# Patient Record
Sex: Male | Born: 1965 | Race: White | Hispanic: No | Marital: Married | Smoking: Former smoker
Health system: Southern US, Community
[De-identification: ages and names within clinical notes are randomized; demographics above are authoritative.]

## PROBLEM LIST (undated history)

## (undated) DIAGNOSIS — Z8619 Personal history of other infectious and parasitic diseases: Secondary | ICD-10-CM

## (undated) DIAGNOSIS — I10 Essential (primary) hypertension: Secondary | ICD-10-CM

## (undated) DIAGNOSIS — E785 Hyperlipidemia, unspecified: Secondary | ICD-10-CM

## (undated) HISTORY — PX: HERNIA REPAIR: SHX51

## (undated) HISTORY — DX: Essential (primary) hypertension: I10

## (undated) HISTORY — PX: COLONOSCOPY: SHX174

## (undated) HISTORY — DX: Personal history of other infectious and parasitic diseases: Z86.19

---
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
## 2005-06-23 ENCOUNTER — Emergency Department: Payer: Self-pay | Admitting: Emergency Medicine

## 2006-05-17 ENCOUNTER — Emergency Department: Payer: Self-pay | Admitting: Emergency Medicine

## 2008-01-12 ENCOUNTER — Other Ambulatory Visit: Payer: Self-pay

## 2008-01-12 ENCOUNTER — Emergency Department: Payer: Self-pay | Admitting: Unknown Physician Specialty

## 2008-01-13 HISTORY — PX: OTHER SURGICAL HISTORY: SHX169

## 2009-03-28 IMAGING — CR DG CHEST 2V
1 series · 2 of 2 positions shown · non-contrast
Comparison: none

REASON FOR EXAM: Chest Pain
COMMENTS:

PROCEDURE:     DXR - DXR CHEST PA (OR AP) AND LATERAL  - January 12, 2008  [DATE]
RESULT:      The lung fields are clear. The heart, mediastinal and osseous
structures show no significant abnormalities.

[Series 1: view not recorded · 0.17mm/px · 2 of 2 slices shown]
[im 1/2]
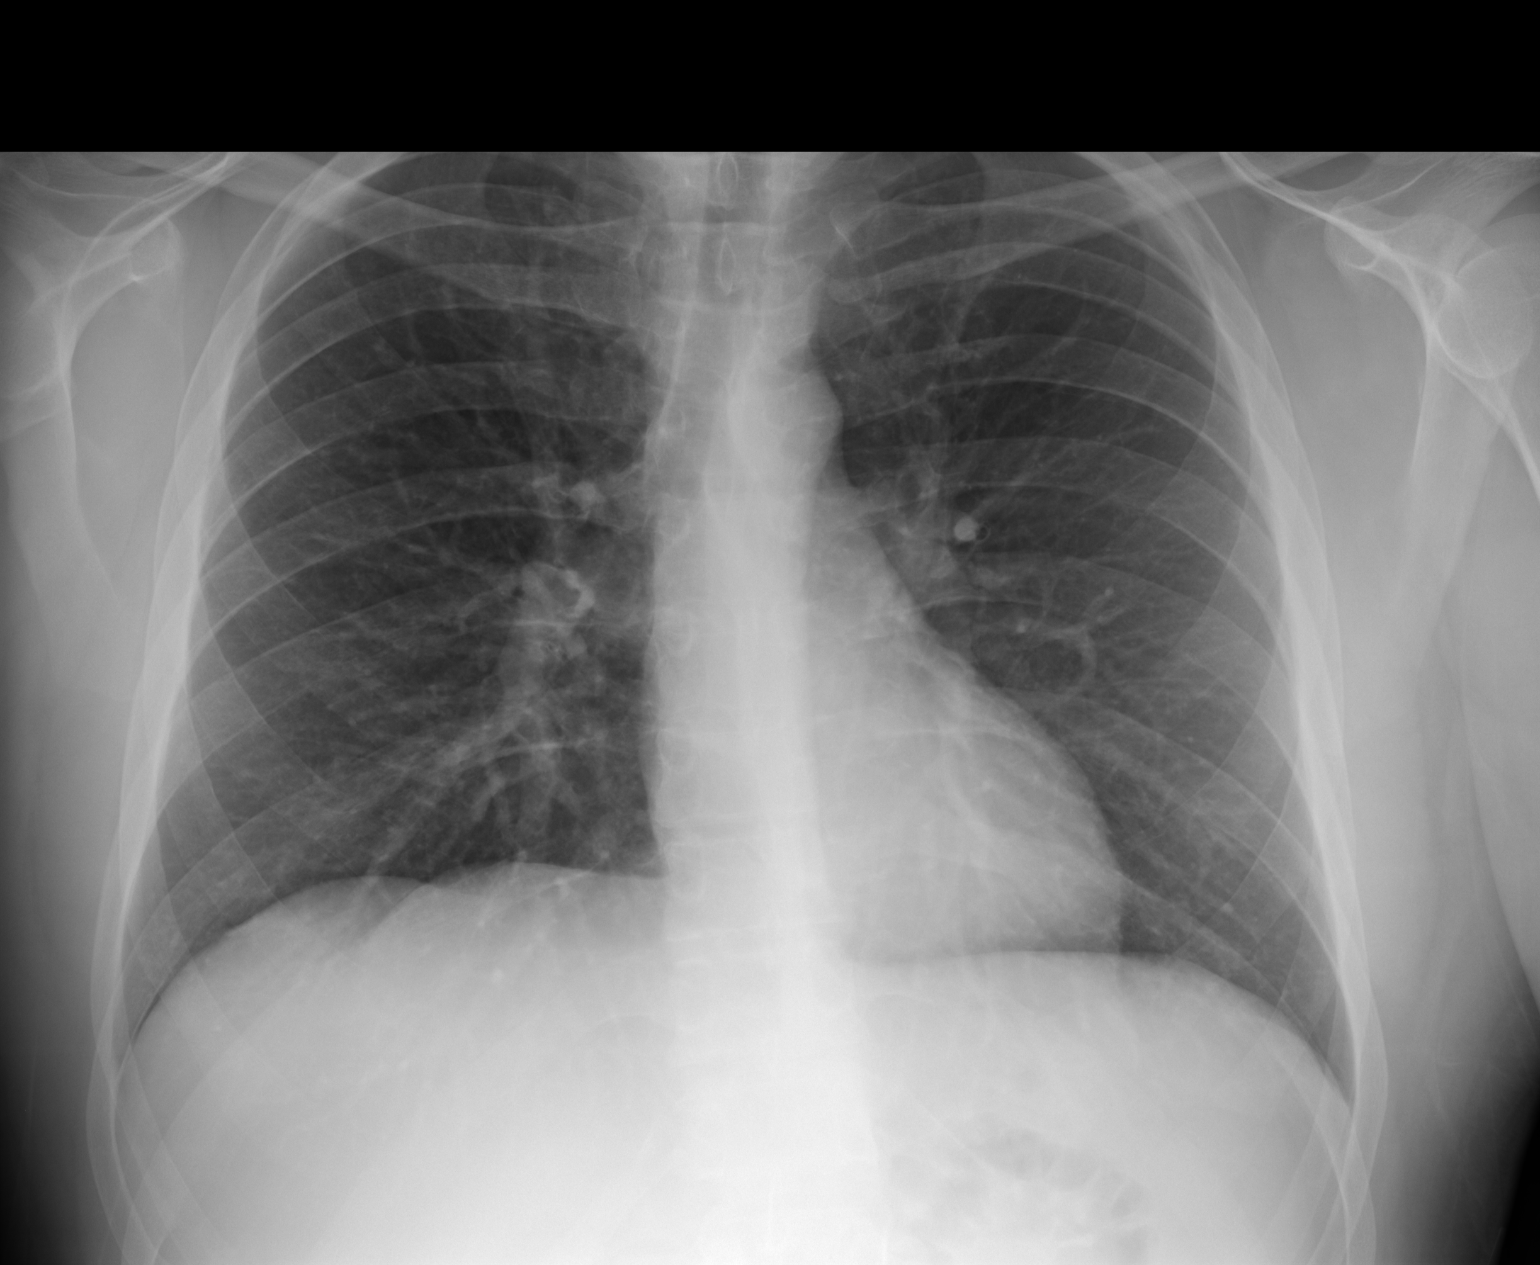
[im 2/2]
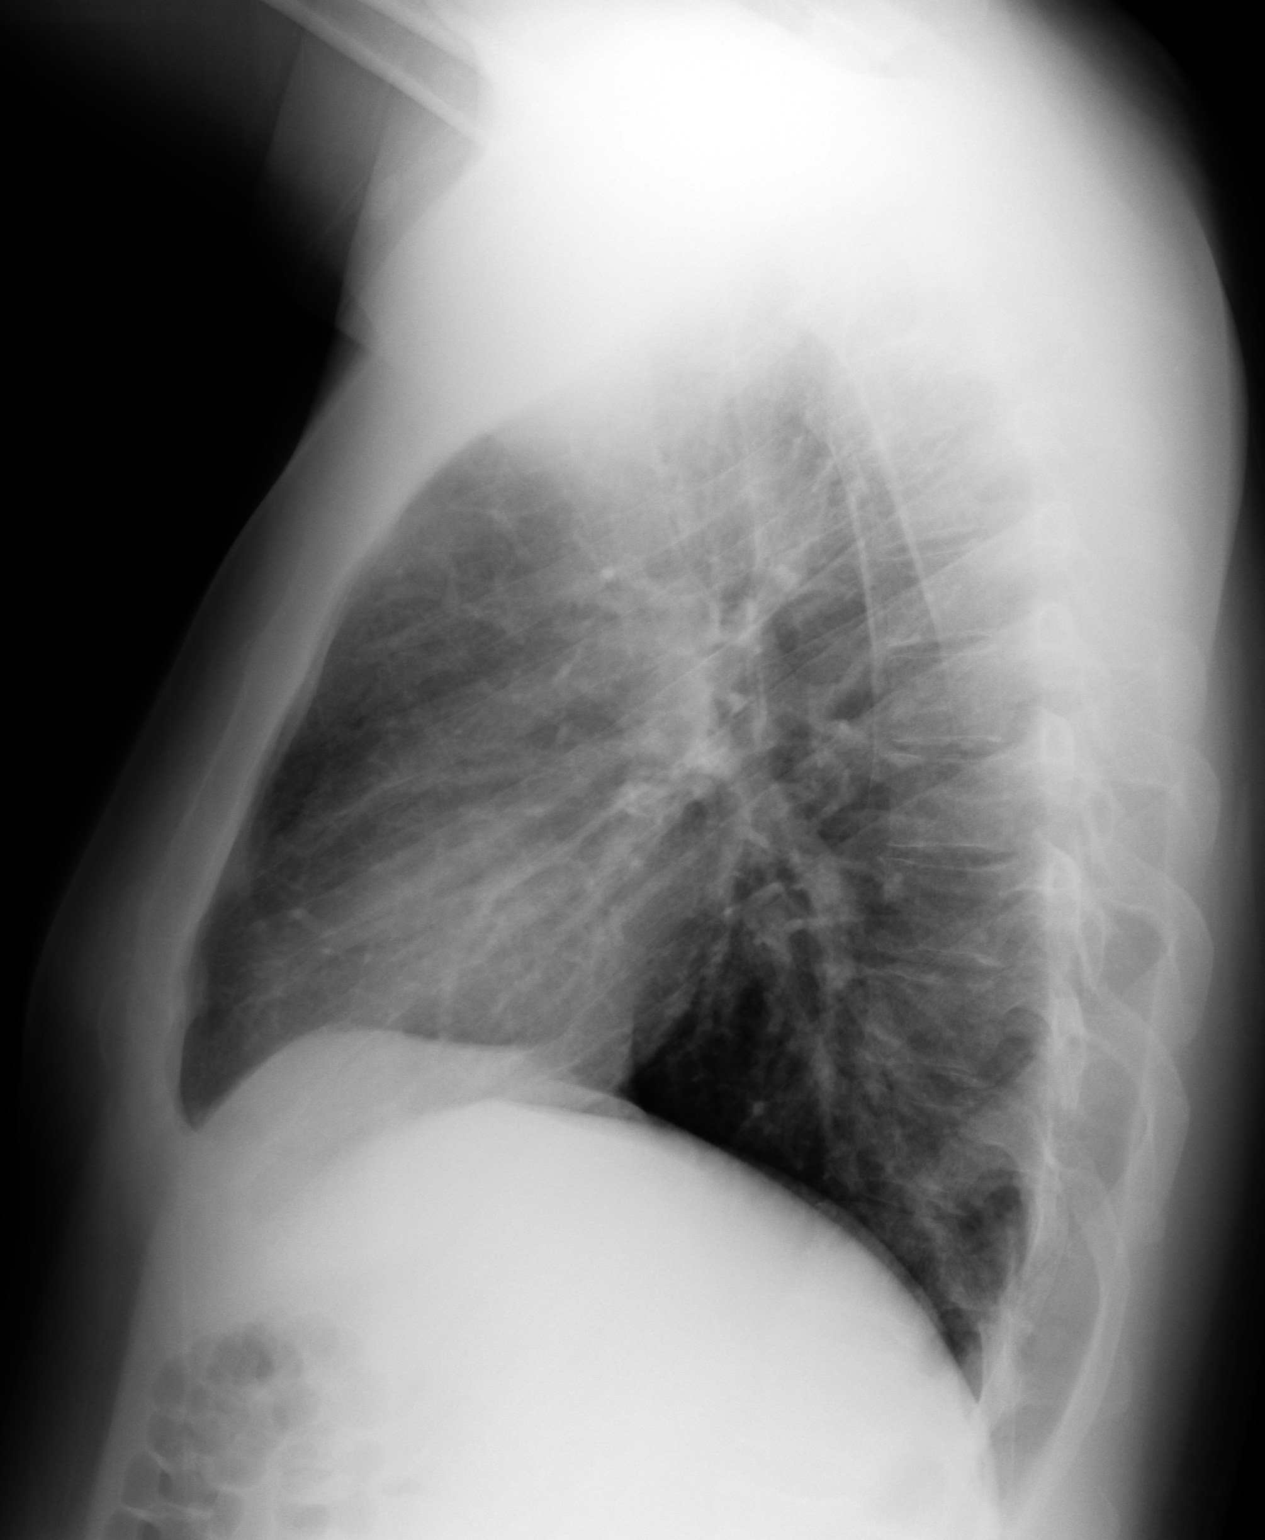

[2 of 2 positions shown; findings below may reference images not displayed]

IMPRESSION: No acute changes are identified.

## 2011-05-22 LAB — LIPID PANEL
CHOLESTEROL: 223 mg/dL — AB (ref 0–200)
HDL: 37 mg/dL (ref 35–70)
LDL Cholesterol: 133 mg/dL
Triglycerides: 267 mg/dL — AB (ref 40–160)

## 2011-05-22 LAB — HEPATIC FUNCTION PANEL: ALT: 25 U/L (ref 10–40)

## 2014-06-08 ENCOUNTER — Ambulatory Visit: Payer: Self-pay | Admitting: Emergency Medicine

## 2014-06-10 ENCOUNTER — Ambulatory Visit: Payer: Self-pay | Admitting: Family Medicine

## 2014-06-18 ENCOUNTER — Ambulatory Visit: Payer: Self-pay

## 2014-12-21 ENCOUNTER — Ambulatory Visit
Admission: EM | Admit: 2014-12-21 | Discharge: 2014-12-21 | Disposition: A | Discharge status: Home / Self Care | Payer: BC Managed Care – PPO | Attending: Family Medicine | Admitting: Family Medicine

## 2014-12-21 ENCOUNTER — Ambulatory Visit: Payer: BC Managed Care – PPO

## 2014-12-21 DIAGNOSIS — M791 Myalgia, unspecified site: Secondary | ICD-10-CM

## 2014-12-21 DIAGNOSIS — M6789 Other specified disorders of synovium and tendon, multiple sites: Secondary | ICD-10-CM | POA: Diagnosis not present

## 2014-12-21 DIAGNOSIS — M79672 Pain in left foot: Secondary | ICD-10-CM | POA: Insufficient documentation

## 2014-12-21 NOTE — ED Provider Notes (Signed)
Patient presents today with symptoms of left lateral foot pain for the last few weeks. Patient denies any trauma or injury to the site. He denies any past history of foot fracture. He does describe the pain as intermittent but daily. He usually wears dress shoes at work and is an Geophysicist/field seismologist principal. He denies any increase in athletic activity recently.  Review of systems negative except mentioned above.  Vitals as per chart.   GENERAL: NAD LEFT FOOT/ANKLE: no deformity appreciated, tenderness along base of the 5th metatarsal extending slightly proximally, FROM, nv intact. There is no pain or swelling along the ankle. Some discomfort in the area with walking on tip toes.  NEURO: CN II-XII groslly intact   A/P: Left lateral foot pain-discussed with patient that there does not appear to be an acute or healing fracture at the base of the fifth metatarsal on x-ray, would treat like peroneal tendinitis at this point. Encourage patient on Cam Walker or other proper shoewear. Patient declined Cam Walker at this time. Encouraged Aleve or Motrin daily for a week. Ice when necessary. If symptoms do persist or worsen I have asked the patient to follow up with podiatry.  Jolene Provost, MD 12/21/14 [id number]

## 2014-12-21 NOTE — ED Notes (Signed)
States left outer foot has been painful for several weeks. Denies injury or trauma

## 2014-12-21 NOTE — Discharge Instructions (Signed)
Ice, proper shoewear, Aleve, f/u with podiatry in 2 weeks if still having symptoms

## 2015-02-17 ENCOUNTER — Emergency Department
Admission: EM | Admit: 2015-02-17 | Discharge: 2015-02-17 | Disposition: A | Discharge status: Home / Self Care | Payer: Worker's Compensation | Attending: Emergency Medicine | Admitting: Emergency Medicine

## 2015-02-17 ENCOUNTER — Encounter: Payer: Self-pay | Admitting: *Deleted

## 2015-02-17 DIAGNOSIS — S61512A Laceration without foreign body of left wrist, initial encounter: Secondary | ICD-10-CM

## 2015-02-17 DIAGNOSIS — Y9289 Other specified places as the place of occurrence of the external cause: Secondary | ICD-10-CM | POA: Diagnosis not present

## 2015-02-17 DIAGNOSIS — Z87891 Personal history of nicotine dependence: Secondary | ICD-10-CM | POA: Insufficient documentation

## 2015-02-17 DIAGNOSIS — S29012A Strain of muscle and tendon of back wall of thorax, initial encounter: Secondary | ICD-10-CM | POA: Insufficient documentation

## 2015-02-17 DIAGNOSIS — Y998 Other external cause status: Secondary | ICD-10-CM | POA: Diagnosis not present

## 2015-02-17 DIAGNOSIS — Y9389 Activity, other specified: Secondary | ICD-10-CM | POA: Insufficient documentation

## 2015-02-17 DIAGNOSIS — Y288XXA Contact with other sharp object, undetermined intent, initial encounter: Secondary | ICD-10-CM | POA: Diagnosis not present

## 2015-02-17 MED ORDER — LIDOCAINE HCL (PF) 1 % IJ SOLN
5.0000 mL | Freq: Once | INTRAMUSCULAR | Status: AC
  Administered 2015-02-17: 5 mL
  Filled 2015-02-17: qty 5

## 2015-02-17 MED ORDER — BACITRACIN ZINC 500 UNIT/GM EX OINT
TOPICAL_OINTMENT | Freq: Two times a day (BID) | CUTANEOUS | Status: DC
Start: 2015-02-17 — End: 2015-02-17
  Administered 2015-02-17: 13:00:00 via TOPICAL
  Filled 2015-02-17: qty 0.9

## 2015-02-17 NOTE — ED Provider Notes (Signed)
[facility] Emergency Department Provider Note ____________________________________________  Time seen: 1120  I have reviewed the triage vital signs and the nursing notes.  HISTORY  Chief Complaint  Laceration  HPI Shaun Holland is a 49 y.o. male reports to the ED for evaluation and treatment of a laceration to the left wrist. He was moving [employer]eps[employer] drink machine and it slipped off the dolly and he tried to keep it upright. He arrives with laceration to left wrist, and also complains of bilateral shoulder pain at this time from the strain of moving machine, bleeding controlled. He is right handed.   Past Medical History  Diagnosis Date  . MI (myocardial infarction)    There are no active problems to display for this patient.  History reviewed. No pertinent past surgical history.  No current outpatient prescriptions on file.  Allergies Review of patient's allergies indicates no known allergies.  Family History  Problem Relation Age of Onset  . Diabetes Father   . Hypertension Father    Social History Social History  Substance Use Topics  . Smoking status: Former Games developer  . Smokeless tobacco: None  . Alcohol Use: Yes     Comment: socially   Review of Systems  Constitutional: Negative for fever. Eyes: Negative for visual changes. ENT: Negative for sore throat. Cardiovascular: Negative for chest pain. Respiratory: Negative for shortness of breath. Gastrointestinal: Negative for abdominal pain, vomiting and diarrhea. Genitourinary: Negative for dysuria. Musculoskeletal: Bilateral upper back strain Skin: Negative for rash. Laceration to the left wrist Neurological: Negative for headaches, focal weakness or numbness. ____________________________________________  PHYSICAL EXAM:  VITAL SIGNS: ED Triage Vitals  Enc Vitals Group     BP 02/17/15 1111 137/90 mmHg     Pulse Rate 02/17/15 1111 88     Resp --      Temp 02/17/15 1111 98.2 F  (36.8 C)     Temp Source 02/17/15 1111 Oral     SpO2 02/17/15 1111 96 %     Weight 02/17/15 1111 250 lb (113.399 kg)     Height 02/17/15 1111  (1.905 m)     Head Cir --      Peak Flow --      Pain Score 02/17/15 1111 6     Pain Loc --      Pain Edu? --      Excl. in GC? --    Constitutional: Alert and oriented. Well appearing and in no distress. Eyes: Conjunctivae are normal. PERRL. Normal extraocular movements. ENT   Head: Normocephalic and atraumatic.   Nose: No congestion/rhinnorhea.   Mouth/Throat: Mucous membranes are moist.   Neck: Supple. No thyromegaly. Hematological/Lymphatic/Immunilogical: No cervical lymphadenopathy. Cardiovascular: Normal rate, regular rhythm. Normal distal pulses Respiratory: Normal respiratory effort. No wheezes/rales/rhonchi. Gastrointestinal: Soft and nontender. No distention. Musculoskeletal: Nontender with normal range of motion in all extremities. Normal grips bilaterally.  Neurologic:  CN II-XII grossly intact. Normal gait without ataxia. Normal speech and language. No gross focal neurologic deficits are appreciated.  Skin:  Skin is warm, dry and intact. No rash noted. Left palm with a semilunar flap laceration to the palm at the base of the thenar prominence. Bleeding is controlled.  Psychiatric: Mood and affect are normal. Patient exhibits appropriate insight and judgment. ____________________________________________  LACERATION REPAIR Performed by: Lissa Hoard Authorized by: Lissa Hoard Consent: Verbal consent obtained. Risks and benefits: risks, benefits and alternatives were discussed Consent given by: patient Patient identity confirmed: provided demographic  data Prepped and Draped in normal sterile fashion Wound explored  Laceration Location: Left Wrist  Laceration Length: 4cm  No Foreign Bodies seen or palpated  Anesthesia: local infiltration  Local anesthetic: lidocaine 1% w/  epinephrine  Anesthetic total: 5 ml  Irrigation method: syringe Amount of cleaning: standard  Skin closure: 4-0 nylon   Number of sutures: 5 + 2  Technique: Horizontal mattress + interrupted   Patient tolerance: Patient tolerated the procedure well with no immediate complications. ____________________________________________  INITIAL IMPRESSION / ASSESSMENT AND PLAN / ED COURSE  Left hand laceration repair. Strain of the trapezius muscles. Wound care instructions provided. Take OTC Tylenol and Motrin. Return in 2 weeks for suture removal. Return in the interim for wound check as needed.  ____________________________________________  FINAL CLINICAL IMPRESSION(S) / ED DIAGNOSES  Final diagnoses:  Laceration of wrist without complication, left, initial encounter     Lissa Hoard, PA-C 02/18/15 1912  Loleta Rose, MD 02/19/15 0002

## 2015-02-17 NOTE — ED Notes (Signed)
Dressing applied to left wrist

## 2015-02-17 NOTE — Discharge Instructions (Signed)
Laceration Care, Adult °A laceration is a cut or lesion that goes through all layers of the skin and into the tissue just beneath the skin. °TREATMENT  °Some lacerations may not require closure. Some lacerations may not be able to be closed due to an increased risk of infection. It is important to see your caregiver as soon as possible after an injury to minimize the risk of infection and maximize the opportunity for successful closure. °If closure is appropriate, pain medicines may be given, if needed. The wound will be cleaned to help prevent infection. Your caregiver will use stitches (sutures), staples, wound glue (adhesive), or skin adhesive strips to repair the laceration. These tools bring the skin edges together to allow for faster healing and a better cosmetic outcome. However, all wounds will heal with a scar. Once the wound has healed, scarring can be minimized by covering the wound with sunscreen during the day for 1 full year. °HOME CARE INSTRUCTIONS  °For sutures or staples: °· Keep the wound clean and dry. °· If you were given a bandage (dressing), you should change it at least once a day. Also, change the dressing if it becomes wet or dirty, or as directed by your caregiver. °· Wash the wound with soap and water 2 times a day. Rinse the wound off with water to remove all soap. Pat the wound dry with a clean towel. °· After cleaning, apply a thin layer of the antibiotic ointment as recommended by your caregiver. This will help prevent infection and keep the dressing from sticking. °· You may shower as usual after the first 24 hours. Do not soak the wound in water until the sutures are removed. °· Only take over-the-counter or prescription medicines for pain, discomfort, or fever as directed by your caregiver. °· Get your sutures or staples removed as directed by your caregiver. °For skin adhesive strips: °· Keep the wound clean and dry. °· Do not get the skin adhesive strips wet. You may bathe  carefully, using caution to keep the wound dry. °· If the wound gets wet, pat it dry with a clean towel. °· Skin adhesive strips will fall off on their own. You may trim the strips as the wound heals. Do not remove skin adhesive strips that are still stuck to the wound. They will fall off in time. °For wound adhesive: °· You may briefly wet your wound in the shower or bath. Do not soak or scrub the wound. Do not swim. Avoid periods of heavy perspiration until the skin adhesive has fallen off on its own. After showering or bathing, gently pat the wound dry with a clean towel. °· Do not apply liquid medicine, cream medicine, or ointment medicine to your wound while the skin adhesive is in place. This may loosen the film before your wound is healed. °· If a dressing is placed over the wound, be careful not to apply tape directly over the skin adhesive. This may cause the adhesive to be pulled off before the wound is healed. °· Avoid prolonged exposure to sunlight or tanning lamps while the skin adhesive is in place. Exposure to ultraviolet light in the first year will darken the scar. °· The skin adhesive will usually remain in place for 5 to 10 days, then naturally fall off the skin. Do not pick at the adhesive film. °You may need a tetanus shot if: °· You cannot remember when you had your last tetanus shot. °· You have never had a tetanus   shot. If you get a tetanus shot, your arm may swell, get red, and feel warm to the touch. This is common and not a problem. If you need a tetanus shot and you choose not to have one, there is a rare chance of getting tetanus. Sickness from tetanus can be serious. SEEK MEDICAL CARE IF:   You have redness, swelling, or increasing pain in the wound.  You see a red line that goes away from the wound.  You have yellowish-white fluid (pus) coming from the wound.  You have a fever.  You notice a bad smell coming from the wound or dressing.  Your wound breaks open before or  after sutures have been removed.  You notice something coming out of the wound such as wood or glass.  Your wound is on your hand or foot and you cannot move a finger or toe. SEEK IMMEDIATE MEDICAL CARE IF:   Your pain is not controlled with prescribed medicine.  You have severe swelling around the wound causing pain and numbness or a change in color in your arm, hand, leg, or foot.  Your wound splits open and starts bleeding.  You have worsening numbness, weakness, or loss of function of any joint around or beyond the wound.  You develop painful lumps near the wound or on the skin anywhere on your body. MAKE SURE YOU:   Understand these instructions.  Will watch your condition.  Will get help right away if you are not doing well or get worse. Document Released: 06/17/2005 Document Revised: 09/09/2011 Document Reviewed: 12/11/2010 [facility] Patient Information 2015 [place], [place]. This information is not intended to replace advice given to you by your health care provider. Make sure you discuss any questions you have with your health care provider.   Keep the wound clean, dry, and covered. Return in 2 weeks for suture removal.

## 2015-02-17 NOTE — ED Notes (Signed)
Pt was moving pepsi drink machine and it slipped off the dolly and pt arrives with laceration to left wrist, pt also complains of shoulder pain at this time from the strain of moving machine, bleeding controlled, left hand warm, sensation intact, cap refill <3 secs

## 2015-03-03 ENCOUNTER — Emergency Department
Admission: EM | Admit: 2015-03-03 | Discharge: 2015-03-03 | Disposition: A | Discharge status: Home / Self Care | Payer: BC Managed Care – PPO | Attending: Emergency Medicine | Admitting: Emergency Medicine

## 2015-03-03 DIAGNOSIS — Z87891 Personal history of nicotine dependence: Secondary | ICD-10-CM | POA: Insufficient documentation

## 2015-03-03 DIAGNOSIS — Z4802 Encounter for removal of sutures: Secondary | ICD-10-CM | POA: Diagnosis present

## 2015-03-03 NOTE — ED Provider Notes (Signed)
[facility] Emergency Department Provider Note   ____________________________________________  Time seen: 8:54 AM  I have reviewed the triage vital signs and the nursing notes.   HISTORY  Chief Complaint Suture / Staple Removal   HPI Shaun Holland is a 49 y.o. male presents to the emergency department for suture removal.Sutures were inserted here on 02/19/2015. He has no complaints today.     Past Medical History  Diagnosis Date  . MI (myocardial infarction)     There are no active problems to display for this patient.   History reviewed. No pertinent past surgical history.  No current outpatient prescriptions on file.  Allergies Review of patient's allergies indicates no known allergies.  Family History  Problem Relation Age of Onset  . Diabetes Father   . Hypertension Father     Social History Social History  Substance Use Topics  . Smoking status: Former Games developer  . Smokeless tobacco: None  . Alcohol Use: Yes     Comment: socially    Review of Systems  Constitutional: Denies fever.  HEENT: No change from baseline Respiratory: No cough or shortness of breath Musculoskeletal: No pain. Skin: healing wound; pain gradually resolving.  ____________________________________________   PHYSICAL EXAM:  VITAL SIGNS: ED Triage Vitals  Enc Vitals Group     BP 03/03/15 0849 150/108 mmHg     Pulse Rate 03/03/15 0849 79     Resp 03/03/15 0849 17     Temp 03/03/15 0849 97.9 F (36.6 C)     Temp Source 03/03/15 0849 Oral     SpO2 03/03/15 0849 98 %     Weight 03/03/15 0849 250 lb (113.399 kg)     Height 03/03/15 0849  (1.88 m)     Head Cir --      Peak Flow --      Pain Score 03/03/15 0846 2     Pain Loc --      Pain Edu? --      Excl. in GC? --       Constitutional: Appears well. No distress HEENT: Atraumtaic, normal appearance, EOMI, sclera normal, voice normal. Respiratory: Respirations even and unlabored.   Cardiovascular: Capillary refill normal. Peripheral pulses 2+ Musculoskeletal: Full ROM x 4. Skin: Laceration to the left hand. No evidence of infection or cellulitis. Neurovascular: Gait steady; Alert and oriented x 4.   PROCEDURES  Procedure(s) performed: SUTURE REMOVAL Performed by:   Consent: Verbal consent obtained. Patient identity confirmed: provided demographic data Time out: Immediately prior to procedure a "time out" was called to verify the correct patient, procedure, equipment, support staff and site/side marked as required.  Location details: Left hand palmar surface near the wrist  Wound Appearance: clean  Sutures/Staples Removed: 5   Facility: sutures placed in this facility Patient tolerance: Patient tolerated the procedure well with no immediate complications.    ____________________________________________   INITIAL IMPRESSION / ASSESSMENT AND PLAN / ED COURSE  Pertinent labs & imaging results that were available during my care of the patient were reviewed by me and considered in my medical decision making (see chart for details).  Wound care discussed. Patient was advised to return to the ER for symptoms that change or worsen if unable to schedule an appointment with primary care.  ____________________________________________   FINAL CLINICAL IMPRESSION(S) / ED DIAGNOSES  Final diagnoses:  Visit for suture removal      Chinita Pester, FNP 03/03/15 1054  Sharyn Creamer, MD 03/03/15 1526

## 2015-03-03 NOTE — ED Notes (Signed)
Pt here for suture removal of left hand

## 2015-03-03 NOTE — Discharge Instructions (Signed)

## 2015-12-13 ENCOUNTER — Encounter: Payer: Self-pay | Admitting: Family Medicine

## 2015-12-13 ENCOUNTER — Ambulatory Visit (INDEPENDENT_AMBULATORY_CARE_PROVIDER_SITE_OTHER): Payer: BC Managed Care – PPO | Admitting: Family Medicine

## 2015-12-13 VITALS — BP 118/82 | HR 77 | Temp 98.0°F | Resp 16 | Wt 254.0 lb

## 2015-12-13 DIAGNOSIS — N539 Unspecified male sexual dysfunction: Secondary | ICD-10-CM | POA: Insufficient documentation

## 2015-12-13 DIAGNOSIS — J029 Acute pharyngitis, unspecified: Secondary | ICD-10-CM

## 2015-12-13 DIAGNOSIS — K649 Unspecified hemorrhoids: Secondary | ICD-10-CM | POA: Insufficient documentation

## 2015-12-13 DIAGNOSIS — K429 Umbilical hernia without obstruction or gangrene: Secondary | ICD-10-CM | POA: Insufficient documentation

## 2015-12-13 LAB — POCT RAPID STREP A (OFFICE): Rapid Strep A Screen: NEGATIVE

## 2015-12-13 NOTE — Progress Notes (Signed)
       Patient: Shaun Holland Male    DOB: [birth year]   49 y.o.   MRN: [id number] Visit Date: 12/13/2015  Today's Provider: Mila Merryonald Sherrilynn Gudgel, MD   Chief Complaint  Patient presents with  . Sore Throat    x 3 days   Subjective:    Sore Throat  This is a new problem. Episode onset: 3 days ago. The problem has been unchanged. The pain is worse on the right side. There has been no fever. Associated symptoms include coughing (dry), headaches, neck pain, shortness of breath and swollen glands. Pertinent negatives include no abdominal pain, congestion, diarrhea, drooling, ear discharge, ear pain, hoarse voice, plugged ear sensation or vomiting. He has had exposure to strep (daughter had strep 2 weeks ago). He has tried nothing for the symptoms.       No Known Allergies Current Meds  Medication Sig  . aspirin 81 MG tablet Take 1 tablet by mouth daily.    Review of Systems  Constitutional: Positive for fatigue. Negative for fever, chills and appetite change.  HENT: Positive for sore throat. Negative for congestion, drooling, ear discharge, ear pain and hoarse voice.   Respiratory: Positive for cough (dry) and shortness of breath. Negative for chest tightness and wheezing.   Cardiovascular: Negative for chest pain and palpitations.  Gastrointestinal: Negative for nausea, vomiting, abdominal pain and diarrhea.  Musculoskeletal: Positive for myalgias and neck pain.  Neurological: Positive for headaches.    Social History  Substance Use Topics  . Smoking status: Former Smoker -- 0.75 packs/day for 22 years    Types: Cigarettes    Quit date: 01/29/2014  . Smokeless tobacco: Not on file  . Alcohol Use: 0.0 oz/week    0 Standard drinks or equivalent per week     Comment: socially   Objective:   BP 118/82 mmHg  Pulse 77  Temp(Src) 98 F (36.7 C) (Oral)  Resp 16  Wt 254 lb (115.214 kg)  SpO2 96%  Physical Exam  General Appearance:    Alert, cooperative, no distress    HENT:   bilateral TM normal without fluid or infection, pharynx erythematous without exudate, sinuses nontender and post nasal drip noted  Eyes:    PERRL, conjunctiva/corneas clear, EOM's intact       Lungs:     Clear to auscultation bilaterally, respirations unlabored  Heart:    Regular rate and rhythm  Neurologic:   Awake, alert, oriented x 3. No apparent focal neurological           defect.       Results for orders placed or performed in visit on 12/13/15  POCT rapid strep A  Result Value Ref Range   Rapid Strep A Screen Negative Negative        Assessment & Plan:     1. Sore throat Counseled regarding signs and symptoms of viral and bacterial respiratory infections. Advised to call or return for additional evaluation if he develops any sign of bacterial infection, or if current symptoms last longer than 10 days.   - POCT rapid strep A     The entirety of the information documented in the History of Present Illness, Review of Systems and Physical Exam were personally obtained by me. Portions of this information were initially documented by Awilda Billoshena Chambers, CMA and reviewed by me for thoroughness and accuracy.    Mila Merryonald Rosemaria Inabinet, MD  [facility]

## 2015-12-13 NOTE — Patient Instructions (Signed)

## 2016-03-06 IMAGING — CR DG FOOT COMPLETE 3+V*L*
3 series · 3 of 3 positions shown · non-contrast
Comparison: None.

CLINICAL DATA: Left foot pain for 2 weeks, no known injury, initial
encounter

EXAM:
LEFT FOOT - COMPLETE 3+ VIEW

[foot ap]
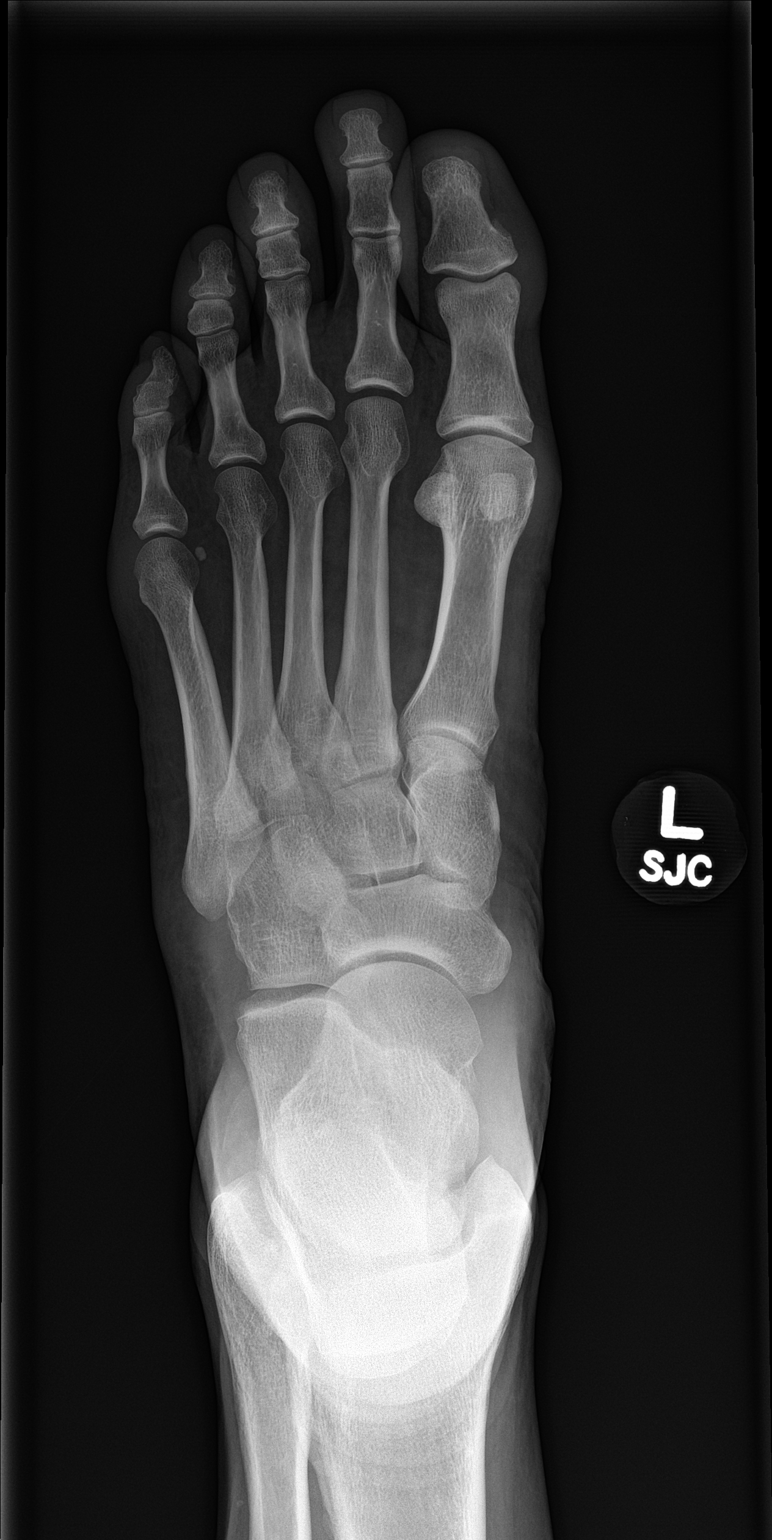

[foot obl]
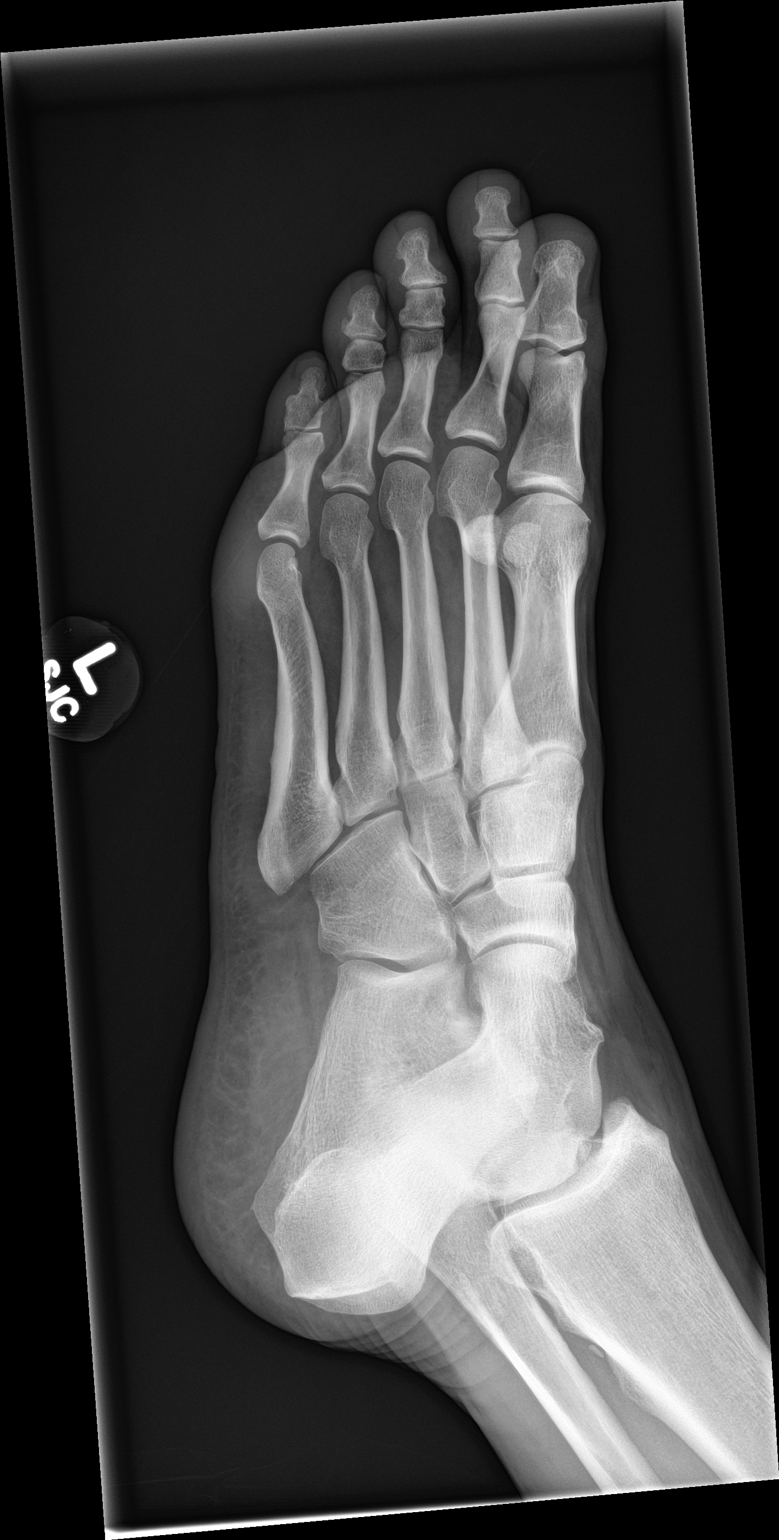

[foot lat]
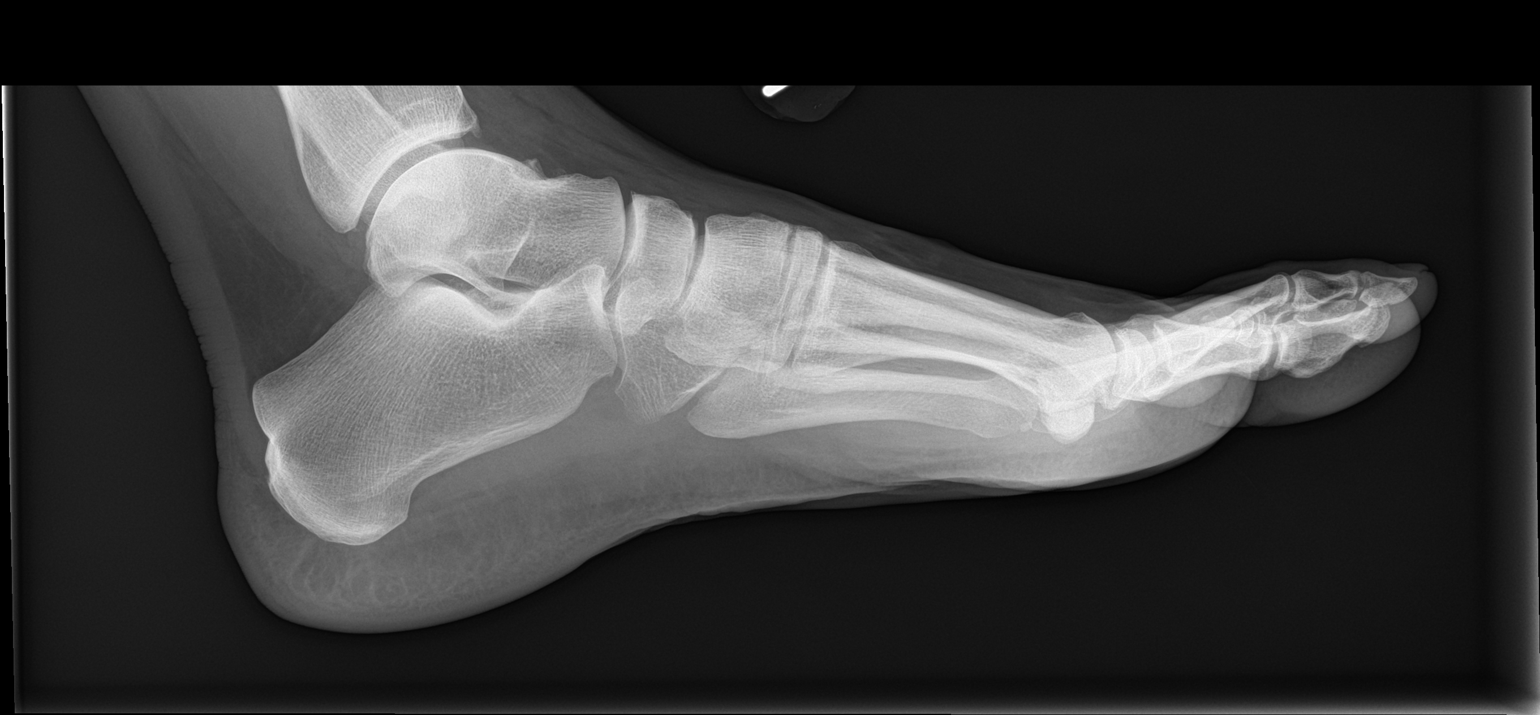

[3 of 3 positions shown; findings below may reference images not displayed]

FINDINGS: There is no evidence of fracture or dislocation. There is no
evidence of arthropathy or other focal bone abnormality. Soft
tissues are unremarkable.
IMPRESSION: No acute abnormality noted.

## 2017-06-10 ENCOUNTER — Encounter: Payer: Self-pay | Admitting: Gynecology

## 2017-06-10 ENCOUNTER — Other Ambulatory Visit: Payer: Self-pay

## 2017-06-10 ENCOUNTER — Ambulatory Visit
Admission: EM | Admit: 2017-06-10 | Discharge: 2017-06-10 | Disposition: A | Discharge status: Home / Self Care | Payer: BC Managed Care – PPO | Attending: Family Medicine | Admitting: Family Medicine

## 2017-06-10 DIAGNOSIS — M25532 Pain in left wrist: Secondary | ICD-10-CM

## 2017-06-10 MED ORDER — MELOXICAM 15 MG PO TABS: 15.0000 mg | ORAL_TABLET | Freq: Every day | ORAL | 0 refills | Status: DC

## 2017-06-10 NOTE — Discharge Instructions (Signed)
Wear the brace for the next 2 weeks.  Mobic as prescribed.  Take care  Dr. Adriana Simasook

## 2017-06-10 NOTE — ED Triage Notes (Signed)
Patient c/o left wrist pain x 1 month ago. Per patient not sure of the reason for the pain.

## 2017-06-10 NOTE — ED Provider Notes (Signed)
[facility]    CSN: [id number] Arrival date & time: 06/10/17  1014  History   Chief Complaint Chief Complaint  Patient presents with  . Wrist Pain   HPI  51 year old male presents with left wrist pain.  Left wrist pain  Times 1 month.  Located on the ulnar side of the wrist.  Described as achy.  Sharp pain at times. Mild-moderate in severity. Currently 4/10.  He is taking ibuprofen briefly without improvement.  Worse with certain ranges of motion.  No known relieving factors.  He does not recall an inciting fall, trauma, injury.  No reports of swelling.  No other associated symptoms.  No other complaints at this time.  Past Medical History:  Diagnosis Date  . History of chicken pox   . History of mumps   . Hyperlipidemia   . MI (myocardial infarction) [facility](HCC)    Patient Active Problem List   Diagnosis Date Noted  . Hemorrhoid 12/13/2015  . Umbilical hernia 12/13/2015  . 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 Hyperlipidemia, mixed 02/01/2009  . Fam hx-ischem heart disease 01/31/2009  . Tobacco use disorder 07/01/1978   Past Surgical History:  Procedure Laterality Date  . Echocardiogram  01/13/2008   [facility]; Normal LVF EF= 66%  . Myocardial Perfusion Scan  01/13/2008   Done by Dr. [place]n [facility]. Normal perfusion on function    Home Medications    Prior to Admission medications   Medication Sig Start Date End Date Taking? Authorizing Provider  aspirin 81 MG tablet Take 1 tablet by mouth daily. 03/07/09   [provider]  meloxicam (MOBIC) 15 MG tablet Take 1 tablet (15 mg total) by mouth daily. 06/10/17   Tommie Samsook, Alera Quevedo G, DO    Family History Family History  Problem Relation Age of Onset  . Diabetes Father        type 2  . Hypertension Father   . Hyperlipidemia Father   . Stroke Father   . Heart attack Other   . Heart attack Other        in his 3550's    Social History Social History   Tobacco Use  . Smoking status: Former  Smoker    Packs/day: 0.75    Years: 22.00    Pack years: 16.50    Types: Cigarettes    Last attempt to quit: 01/29/2014    Years since quitting: 3.3  . Smokeless tobacco: Never Used  Substance Use Topics  . Alcohol use: Yes    Alcohol/week: 0.0 oz    Comment: socially  . Drug use: No     Allergies   Patient has no known allergies.   Review of Systems Review of Systems  Constitutional: Negative.   Musculoskeletal:       Wrist pain, left. No swelling.    Physical Exam Triage Vital Signs ED Triage Vitals  Enc Vitals Group     BP 06/10/17 1042 121/84     Pulse Rate 06/10/17 1042 97     Resp 06/10/17 1042 18     Temp 06/10/17 1042 97.9 F (36.6 C)     Temp Source 06/10/17 1042 Oral     SpO2 06/10/17 1042 97 %     Weight 06/10/17 1041 250 lb (113.4 kg)     Height 06/10/17 1041 6\' 3"  (1.905 m)     Head Circumference --      Peak Flow --      Pain Score 06/10/17 1042 4     Pain  Loc --      Pain Edu? --      Excl. in GC? --    No data found.  Updated Vital Signs BP 121/84 (BP Location: Left Arm)   Pulse 97   Temp 97.9 F (36.6 C) (Oral)   Resp 18   Ht 6\' 3"  (1.905 m)   Wt 250 lb (113.4 kg)   SpO2 97%   BMI 31.25 kg/m  Physical Exam  Constitutional: He is oriented to person, place, and time. He appears well-developed and well-nourished. No distress.  HENT:  Head: Normocephalic and atraumatic.  Eyes: Conjunctivae are normal. No scleral icterus.  Cardiovascular: Normal rate and regular rhythm.  No murmur heard. Pulmonary/Chest: Effort normal and breath sounds normal. No respiratory distress. He has no wheezes.  Musculoskeletal:  Left wrist -patient with tenderness over the ulna distally.  No swelling or erythema.  Normal range of motion of the wrist.  Neurological: He is alert and oriented to person, place, and time.  Skin: Skin is warm. No rash noted.  Psychiatric: He has a normal mood and affect. His behavior is normal.  Nursing note and vitals  reviewed.  UC Treatments / Results  Labs (all labs ordered are listed, but only abnormal results are displayed) Labs Reviewed - No data to display  EKG  EKG Interpretation None       Radiology No results found.  Procedures Procedures (including critical care time)  Medications Ordered in UC Medications - No data to display   Initial Impression / Assessment and Plan / UC Course  I have reviewed the triage vital signs and the nursing notes.  Pertinent labs & imaging results that were available during my care of the patient were reviewed by me and considered in my medical decision making (see chart for details).     51 year old male presents with left wrist pain.  Pain is located on the ulnar aspect.  Possible TFCC issue/injury. Placing in brace. Mobic daily x 2 weeks.  If persists, advised to see orthopedics.  Final Clinical Impressions(s) / UC Diagnoses   Final diagnoses:  Left wrist pain    ED Discharge Orders        Ordered    meloxicam (MOBIC) 15 MG tablet  Daily     06/10/17 1148     Controlled Substance Prescriptions [place] Controlled Substance Registry consulted? Not Applicable   Tommie SamsCook, Aidynn Polendo G, DO 06/10/17 1155

## 2018-02-02 NOTE — Progress Notes (Signed)
Patient: Shaun Holland, Male    DOB: July 03, 1965, 52 y.o.   MRN: [id number] Visit Date: 02/03/2018  Today's Provider: Mila Merry, MD   Chief Complaint  Patient presents with  . Annual Exam  . Hyperlipidemia   Subjective:    Annual physical exam Inman Fettig is a 52 y.o. male who presents today for health maintenance and complete physical. He feels well. He reports exercising yes/walking. He reports he is sleeping well.  ---------------------------------------------------------------   Lipid/Cholesterol, Follow-up:   Last seen for this 05/22/2011.  Management since that visit includes; labs checked. Advised to continue current medication. And cut back on saturated fats.  Last Lipid Panel:    Component Value Date/Time   CHOL 223 (A) 05/22/2011   TRIG 267 (A) 05/22/2011   HDL 37 05/22/2011   LDLCALC 133 05/22/2011    He reports good compliance with treatment. He is not having side effects. none  Wt Readings from Last 3 Encounters:  02/03/18 260 lb (117.9 kg)  06/10/17 250 lb (113.4 kg)  12/13/15 254 lb (115.2 kg)    ---------------------------------------------------------------  Tobacco use From 03/09/2014-recommended smoking cessation.   Review of Systems  Constitutional: Negative for chills, diaphoresis and fever.  HENT: Negative for congestion, ear discharge, ear pain, hearing loss, nosebleeds, sore throat and tinnitus.   Eyes: Negative for photophobia, pain, discharge and redness.  Respiratory: Negative for cough, shortness of breath, wheezing and stridor.   Cardiovascular: Negative for chest pain, palpitations and leg swelling.  Gastrointestinal: Negative for abdominal pain, blood in stool, constipation, diarrhea, nausea and vomiting.  Endocrine: Negative for polydipsia.  Genitourinary: Negative for dysuria, flank pain, frequency, hematuria and urgency.  Musculoskeletal: Positive for back pain. Negative for myalgias and neck pain.    Skin: Negative for rash.  Allergic/Immunologic: Negative for environmental allergies.  Neurological: Negative for dizziness, tremors, seizures, weakness and headaches.  Hematological: Does not bruise/bleed easily.  Psychiatric/Behavioral: Negative for hallucinations and suicidal ideas. The patient is not nervous/anxious.     Social History      He  reports that he quit smoking about 4 years ago. His smoking use included cigarettes. He has a 16.50 pack-year smoking history. He has never used smokeless tobacco. He reports that he drinks alcohol. He reports that he does not use drugs.       Social History   Socioeconomic History  . Marital status: Married    Spouse name: Not on file  . Number of children: Not on file  . Years of education: Not on file  . Highest education level: Not on file  Occupational History  . Occupation: Tax inspector  Social Needs  . Financial resource strain: Not on file  . Food insecurity:    Worry: Not on file    Inability: Not on file  . Transportation needs:    Medical: Not on file    Non-medical: Not on file  Tobacco Use  . Smoking status: Former Smoker    Packs/day: 0.75    Years: 22.00    Pack years: 16.50    Types: Cigarettes    Last attempt to quit: 01/29/2014    Years since quitting: 4.0  . Smokeless tobacco: Never Used  Substance and Sexual Activity  . Alcohol use: Yes    Alcohol/week: 0.0 oz    Comment: socially  . Drug use: No  . Sexual activity: Not on file  Lifestyle  . Physical activity:    Days per week: Not  on file    Minutes per session: Not on file  . Stress: Not on file  Relationships  . Social connections:    Talks on phone: Not on file    Gets together: Not on file    Attends religious service: Not on file    Active member of club or organization: Not on file    Attends meetings of clubs or organizations: Not on file    Relationship status: Not on file  Other Topics Concern  . Not on file  Social History  Narrative  . Not on file    Past Medical History:  Diagnosis Date  . History of chicken pox   . History of mumps   . Hyperlipidemia   . MI (myocardial infarction) [facility])      Patient Active Problem List   Diagnosis Date Noted  . Hemorrhoid 12/13/2015  . Umbilical hernia 12/13/2015  . 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 Hyperlipidemia, mixed 02/01/2009  . Fam hx-ischem heart disease 01/31/2009  . Tobacco use disorder 07/01/1978    Past Surgical History:  Procedure Laterality Date  . Echocardiogram  01/13/2008   [facility]; Normal LVF EF= 66%  . Myocardial Perfusion Scan  01/13/2008   Done by Dr. Juliann Pares. Normal perfusion on function    Family History        Family Status  Relation Name Status  . Father  Alive  . Mother  Alive  . Sister  Alive  . Brother  Alive  . Brother  Alive  . Other Uncle Deceased  . Other Grandparent Deceased        His family history includes Diabetes in his father; Heart attack in his other and other; Hyperlipidemia in his father; Hypertension in his father; Stroke in his father.      No Known Allergies   Current Outpatient Medications:  .  aspirin 81 MG tablet, Take 1 tablet by mouth daily., Disp: , Rfl:  .  meloxicam (MOBIC) 15 MG tablet, Take 1 tablet (15 mg total) by mouth daily. (Patient not taking: Reported on 02/03/2018), Disp: 15 tablet, Rfl: 0   Patient Care Team: Malva Limes, MD as PCP - General (Family Medicine) Alwyn Pea, MD as Consulting Physician (Cardiology)      Objective:   Vitals: BP 112/77 (BP Location: Right Arm, Patient Position: Sitting, Cuff Size: Large)   Pulse 73   Temp 98 F (36.7 C) (Oral)   Resp 16   Ht 6\' 2"  (1.88 m)   Wt 260 lb (117.9 kg)   SpO2 99%   BMI 33.38 kg/m       Physical Exam   General Appearance:    Alert, cooperative, no distress, appears stated age  Head:    Normocephalic, without obvious abnormality, atraumatic  Eyes:    PERRL, conjunctiva/corneas clear, EOM's  intact, fundi    benign, both eyes       Ears:    Normal TM's and external ear canals, both ears  Nose:   Nares normal, septum midline, mucosa normal, no drainage   or sinus tenderness  Throat:   Lips, mucosa, and tongue normal; teeth and gums normal  Neck:   Supple, symmetrical, trachea midline, no adenopathy;       thyroid:  No enlargement/tenderness/nodules; no carotid   bruit or JVD  Back:     Symmetric, no curvature, ROM normal, no CVA tenderness  Lungs:     Clear to auscultation bilaterally, respirations unlabored  Chest wall:  No tenderness or deformity  Heart:    Regular rate and rhythm, S1 and S2 normal, no murmur, rub   or gallop  Abdomen:     Soft, non-tender, bowel sounds active all four quadrants,    no masses, no organomegaly  Genitalia:    deferred  Rectal:    deferred  Extremities:   Extremities normal, atraumatic, no cyanosis or edema  Pulses:   2+ and symmetric all extremities  Skin:   Skin color, texture, turgor normal, no rashes or lesions  Lymph nodes:   Cervical, supraclavicular, and axillary nodes normal  Neurologic:   CNII-XII intact. Normal strength, sensation and reflexes      throughout    Depression Screen PHQ 2/9 Scores 02/03/2018  PHQ - 2 Score 0  PHQ- 9 Score 1      Assessment & Plan:     Routine Health Maintenance and Physical Exam  Exercise Activities and Dietary recommendations Goals    None      Immunization History  Administered Date(s) Administered  . Tdap 10/17/2010    Health Maintenance  Topic Date Due  . HIV Screening  03/23/1982  . COLONOSCOPY  03/23/2017  . INFLUENZA VACCINE  01/29/2018  . TETANUS/TDAP  10/16/2020     Discussed health benefits of physical activity, and encouraged him to engage in regular exercise appropriate for his age and condition.    --------------------------------------------------------------------  1. Annual physical exam  - Comprehensive metabolic panel - Lipid panel - PSA  2. BMI  33.0-33.9,adult   3. Hyperlipidemia, mixed  - Comprehensive metabolic panel - Lipid panel  4. Hemorrhoids, unspecified hemorrhoid type He is bothered by these and is interested in having them removed. Will refer GI. Hi is interested in Cologuard, but hemorrhoid may increase risk for false positive. Will have him discuss colonoscopy with GI versus doing Cologuard after hemorrhoids are removed.   5. Need for shingles vaccine  - Varicella-zoster vaccine IM  6. Prostate cancer screening  - PSA    Mila Merryonald Denym Rahimi, MD  [facility]

## 2018-02-03 ENCOUNTER — Encounter: Payer: Self-pay | Admitting: Family Medicine

## 2018-02-03 ENCOUNTER — Ambulatory Visit (INDEPENDENT_AMBULATORY_CARE_PROVIDER_SITE_OTHER): Payer: BC Managed Care – PPO | Admitting: Family Medicine

## 2018-02-03 VITALS — BP 112/77 | HR 73 | Temp 98.0°F | Resp 16 | Ht 74.0 in | Wt 260.0 lb

## 2018-02-03 DIAGNOSIS — E782 Mixed hyperlipidemia: Secondary | ICD-10-CM | POA: Diagnosis not present

## 2018-02-03 DIAGNOSIS — Z23 Encounter for immunization: Secondary | ICD-10-CM | POA: Diagnosis not present

## 2018-02-03 DIAGNOSIS — Z6833 Body mass index (BMI) 33.0-33.9, adult: Secondary | ICD-10-CM

## 2018-02-03 DIAGNOSIS — K649 Unspecified hemorrhoids: Secondary | ICD-10-CM

## 2018-02-03 DIAGNOSIS — Z125 Encounter for screening for malignant neoplasm of prostate: Secondary | ICD-10-CM

## 2018-02-03 DIAGNOSIS — Z0000 Encounter for general adult medical examination without abnormal findings: Secondary | ICD-10-CM | POA: Diagnosis not present

## 2018-02-03 NOTE — Patient Instructions (Signed)
It is recommended to engage in 150 minutes of moderate exercise every week.   

## 2018-02-09 ENCOUNTER — Encounter: Payer: Self-pay | Admitting: Gastroenterology

## 2018-04-06 ENCOUNTER — Ambulatory Visit: Payer: Self-pay | Admitting: Family Medicine

## 2018-06-03 ENCOUNTER — Encounter: Payer: Self-pay | Admitting: Family Medicine

## 2018-06-03 ENCOUNTER — Ambulatory Visit: Payer: BC Managed Care – PPO | Admitting: Family Medicine

## 2018-06-03 VITALS — BP 122/76 | HR 80 | Temp 98.1°F | Resp 16 | Wt 263.0 lb

## 2018-06-03 DIAGNOSIS — J40 Bronchitis, not specified as acute or chronic: Secondary | ICD-10-CM

## 2018-06-03 MED ORDER — DOXYCYCLINE HYCLATE 100 MG PO TABS: 100.0000 mg | ORAL_TABLET | Freq: Two times a day (BID) | ORAL | 0 refills | Status: DC

## 2018-06-03 NOTE — Progress Notes (Signed)
       Patient: Shaun Holland Male    DOB: [birth year]   52 y.o.   MRN: [id number] Visit Date: 06/03/2018  Today's Provider: Mila Merryonald Ricardo Kayes, MD   Chief Complaint  Patient presents with  . Cough    x 8 days   Subjective:    Cough  This is a new problem. The problem has been unchanged. The cough is productive of sputum. Associated symptoms include chills, a fever (resolved), headaches, nasal congestion, postnasal drip, a sore throat, sweats (resolved) and wheezing. Pertinent negatives include no chest pain, hemoptysis, rhinorrhea or shortness of breath. Treatments tried: Halls cough drops, Tylenol and Tea with Honey. The treatment provided no relief.  He states head congestion and sore throat have resolved, but continues to have persistent cough.    No Known Allergies    Current Outpatient Medications:  .  aspirin 81 MG tablet, Take 1 tablet by mouth daily., Disp: , Rfl:   Review of Systems  Constitutional: Positive for chills, diaphoresis, fatigue and fever (resolved). Negative for appetite change.  HENT: Positive for congestion, postnasal drip and sore throat. Negative for rhinorrhea.   Respiratory: Positive for cough and wheezing. Negative for hemoptysis, chest tightness and shortness of breath.   Cardiovascular: Negative for chest pain and palpitations.  Gastrointestinal: Negative for abdominal pain, nausea and vomiting.  Neurological: Positive for headaches.    Social History   Tobacco Use  . Smoking status: Former Smoker    Packs/day: 0.75    Years: 22.00    Pack years: 16.50    Types: Cigarettes    Last attempt to quit: 01/29/2014    Years since quitting: 4.3  . Smokeless tobacco: Never Used  Substance Use Topics  . Alcohol use: Yes    Alcohol/week: 0.0 standard drinks    Comment: socially   Objective:   BP 122/76 (BP Location: Left Arm, Patient Position: Sitting, Cuff Size: Large)   Pulse 80   Temp 98.1 F (36.7 C) (Oral)   Resp 16   Wt 263 lb (119.3  kg)   SpO2 96% Comment: room air  BMI 33.77 kg/m  Vitals:   06/03/18 0808  BP: 122/76  Pulse: 80  Resp: 16  Temp: 98.1 F (36.7 C)  TempSrc: Oral  SpO2: 96%  Weight: 263 lb (119.3 kg)     Physical Exam  General Appearance:    Alert, cooperative, no distress  HENT:   ENT exam normal, no neck nodes or sinus tenderness  Eyes:    PERRL, conjunctiva/corneas clear, EOM's intact       Lungs:     Occasional faint expiratory wheeze, no rales,  Heart:    Regular rate and rhythm  Neurologic:   Awake, alert, oriented x 3. No apparent focal neurological           defect.           Assessment & Plan:     1. Bronchitis  - doxycycline (VIBRA-TABS) 100 MG tablet; Take 1 tablet (100 mg total) by mouth 2 (two) times daily.  Dispense: 20 tablet; Refill: 0  He declined flu vaccine due to acute illness today.       Mila Merryonald Karuna Balducci, MD  [facility]

## 2018-09-06 ENCOUNTER — Ambulatory Visit
Admission: EM | Admit: 2018-09-06 | Discharge: 2018-09-06 | Disposition: A | Discharge status: Home / Self Care | Payer: BC Managed Care – PPO | Attending: Emergency Medicine | Admitting: Emergency Medicine

## 2018-09-06 ENCOUNTER — Other Ambulatory Visit: Payer: Self-pay

## 2018-09-06 DIAGNOSIS — J029 Acute pharyngitis, unspecified: Secondary | ICD-10-CM

## 2018-09-06 DIAGNOSIS — R05 Cough: Secondary | ICD-10-CM | POA: Diagnosis not present

## 2018-09-06 DIAGNOSIS — Z87891 Personal history of nicotine dependence: Secondary | ICD-10-CM | POA: Diagnosis not present

## 2018-09-06 DIAGNOSIS — R059 Cough, unspecified: Secondary | ICD-10-CM

## 2018-09-06 LAB — RAPID STREP SCREEN (MED CTR MEBANE ONLY): Streptococcus, Group A Screen (Direct): NEGATIVE

## 2018-09-06 MED ORDER — AMOXICILLIN 500 MG PO CAPS: 500.0000 mg | ORAL_CAPSULE | Freq: Two times a day (BID) | ORAL | 0 refills | Status: DC

## 2018-09-06 MED ORDER — BENZONATATE 200 MG PO CAPS: ORAL_CAPSULE | ORAL | 0 refills | Status: DC

## 2018-09-06 NOTE — ED Triage Notes (Signed)
Pt states sore throat and cough starting yesterday after outside in the wind. Pain 3/10.

## 2018-09-06 NOTE — ED Provider Notes (Signed)
[facility]    CSN: [id number] Arrival date & time: 09/06/18  0945     History   Chief Complaint Chief Complaint  Patient presents with  . Sore Throat  . Cough    HPI Shaun Holland is a 53 y.o. male.   HPI  -year-old male presents with a sore throat and cough that started yesterday.  He states he was outside in the wind baseball game.  His pain is a 3 out of 10.  Cough is slightly productive.  His wife states that he was feverish last night but he did not take his temperature and is afebrile today.        Past Medical History:  Diagnosis Date  . History of chicken pox   . History of mumps     Patient Active Problem List   Diagnosis Date Noted  . Hemorrhoids 12/13/2015  . Umbilical hernia 12/13/2015  . 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 Hyperlipidemia, mixed 02/01/2009  . Fam hx-ischem heart disease 01/31/2009  . Tobacco use disorder 07/01/1978    Past Surgical History:  Procedure Laterality Date  . Echocardiogram  01/13/2008   [facility]; Normal LVF EF= 66%  . Myocardial Perfusion Scan  01/13/2008   Done by Dr. [facility]. Normal perfusion on function       Home Medications    Prior to Admission medications   Medication Sig Start Date End Date Taking? Authorizing Provider  amoxicillin (AMOXIL) 500 MG capsule Take 1 capsule (500 mg total) by mouth 2 (two) times daily. 09/06/18   Lutricia Feiloemer, William P, PA-C  benzonatate (TESSALON) 200 MG capsule Take one cap TID PRN cough 09/06/18   Lutricia Feiloemer, William P, PA-C    Family History Family History  Problem Relation Age of Onset  . Diabetes Father        type 2  . Hypertension Father   . Hyperlipidemia Father   . Stroke Father   . Heart attack Other   . Heart attack Other        in his 6350's    Social History Social History   Tobacco Use  . Smoking status: Former Smoker    Packs/day: 0.75    Years: 22.00    Pack years: 16.50    Types: Cigarettes    Last attempt to quit: 01/29/2014   Years since quitting: 4.6  . Smokeless tobacco: Never Used  Substance Use Topics  . Alcohol use: Yes    Alcohol/week: 0.0 standard drinks    Comment: socially  . Drug use: No     Allergies   Patient has no known allergies.   Review of Systems Review of Systems  Constitutional: Positive for activity change. Negative for appetite change, chills, fatigue and fever.  HENT: Positive for sore throat.   Respiratory: Positive for cough.   All other systems reviewed and are negative.    Physical Exam Triage Vital Signs ED Triage Vitals  Enc Vitals Group     BP 09/06/18 1001 (!) 129/92     Pulse Rate 09/06/18 1001 64     Resp 09/06/18 1001 18     Temp 09/06/18 1001 97.8 F (36.6 C)     Temp Source 09/06/18 1001 Oral     SpO2 09/06/18 1001 98 %     Weight 09/06/18 1003 260 lb (117.9 kg)     Height 09/06/18 1003 6' 2.5" (1.892 m)     Head Circumference --      Peak Flow --  Pain Score 09/06/18 1003 3     Pain Loc --      Pain Edu? --      Excl. in GC? --    No data found.  Updated Vital Signs BP (!) 129/92 (BP Location: Left Arm)   Pulse 64   Temp 97.8 F (36.6 C) (Oral)   Resp 18   Ht 6' 2.5" (1.892 m)   Wt 260 lb (117.9 kg)   SpO2 98%   BMI 32.94 kg/m   Visual Acuity Right Eye Distance:   Left Eye Distance:   Bilateral Distance:    Right Eye Near:   Left Eye Near:    Bilateral Near:     Physical Exam Vitals signs and nursing note reviewed.  Constitutional:      General: He is not in acute distress.    Appearance: He is well-developed and normal weight. He is not ill-appearing, toxic-appearing or diaphoretic.  HENT:     Head: Normocephalic and atraumatic.     Right Ear: Tympanic membrane and ear canal normal.     Left Ear: Tympanic membrane and ear canal normal.     Nose: No congestion or rhinorrhea.     Mouth/Throat:     Mouth: Mucous membranes are moist. No oral lesions.     Pharynx: Oropharynx is clear. No pharyngeal swelling, oropharyngeal  exudate, posterior oropharyngeal erythema or uvula swelling.     Tonsils: No tonsillar exudate or tonsillar abscesses. Swelling: 0 on the right. 0 on the left.  Eyes:     Conjunctiva/sclera: Conjunctivae normal.  Neck:     Musculoskeletal: Normal range of motion and neck supple.  Pulmonary:     Effort: Pulmonary effort is normal.     Breath sounds: Normal breath sounds.  Lymphadenopathy:     Cervical: No cervical adenopathy.  Skin:    General: Skin is warm and dry.  Neurological:     General: No focal deficit present.     Mental Status: He is alert and oriented to person, place, and time.  Psychiatric:        Mood and Affect: Mood normal.        Behavior: Behavior normal.      UC Treatments / Results  Labs (all labs ordered are listed, but only abnormal results are displayed) Labs Reviewed  RAPID STREP SCREEN ([facility] ONLY)  CULTURE, GROUP A STREP [facility])    EKG None  Radiology No results found.  Procedures Procedures (including critical care time)  Medications Ordered in UC Medications - No data to display  Initial Impression / Assessment and Plan / UC Course  I have reviewed the triage vital signs and the nursing notes.  Pertinent labs & imaging results that were available during my care of the patient were reviewed by me and considered in my medical decision making (see chart for details).   Reviewed laboratory with the patient showing a negative rapid strep.  Physical exam is unremarkable.  I have told him this is likely a upper respiratory infection with sore throat.  States that previously the rapid strep was negative and he is been called with change over to positive results.  Because of this and he is going on a vacation in [place] for 1 week on Wednesday I will give him a wait-and-see prescription for amoxicillin so that if it does turn positive he can have it filled.  Otherwise he will not have it filled.  Given him a prescription for [employer].  He  will gargle with salt water for throat comfort may also use lozenges or cough drops.   Final Clinical Impressions(s) / UC Diagnoses   Final diagnoses:  Sore throat  Cough   Discharge Instructions   None    ED Prescriptions    Medication Sig Dispense Auth. Provider   benzonatate (TESSALON) 200 MG capsule Take one cap TID PRN cough 30 capsule Ovid Curd P, PA-C   amoxicillin (AMOXIL) 500 MG capsule Take 1 capsule (500 mg total) by mouth 2 (two) times daily. 20 capsule Lutricia Feil, PA-C     Controlled Substance Prescriptions [place] Controlled Substance Registry consulted? Not Applicable   Lutricia Feil, PA-C 09/06/18 1122

## 2018-09-09 LAB — CULTURE, GROUP A STREP (THRC)

## 2019-08-28 ENCOUNTER — Ambulatory Visit: Payer: Self-pay

## 2020-11-15 ENCOUNTER — Ambulatory Visit: Payer: Self-pay

## 2020-11-15 NOTE — Telephone Encounter (Signed)
Pt. Reports he started feeling dizzy today. Worse when he is up walking around.Had the school nurse check him and "my BP and pulse are good." Has never had this before. No availability in the practice.  instructed to go to UC.  Reason for Disposition . [1] MODERATE dizziness (e.g., interferes with normal activities) AND [2] has NOT been evaluated by physician for this  (Exception: dizziness caused by heat exposure, sudden standing, or poor fluid intake)  Answer Assessment - Initial Assessment Questions 1. DESCRIPTION: "Describe your dizziness."     Dizzy 2. LIGHTHEADED: "Do you feel lightheaded?" (e.g., somewhat faint, woozy, weak upon standing)     Yes 3. VERTIGO: "Do you feel like either you or the room is spinning or tilting?" (i.e. vertigo)     No 4. SEVERITY: "How bad is it?"  "Do you feel like you are going to faint?" "Can you stand and walk?"   - MILD: Feels slightly dizzy, but walking normally.   - MODERATE: Feels unsteady when walking, but not falling; interferes with normal activities (e.g., school, work).   - SEVERE: Unable to walk without falling, or requires assistance to walk without falling; feels like passing out now.      Moderate 5. ONSET:  "When did the dizziness begin?"     Today 6. AGGRAVATING FACTORS: "Does anything make it worse?" (e.g., standing, change in head position)     Walking 7. HEART RATE: "Can you tell me your heart rate?" "How many beats in 15 seconds?"  (Note: not all patients can do this)       Nurse at work checked it 8. CAUSE: "What do you think is causing the dizziness?"     Unsure 9. RECURRENT SYMPTOM: "Have you had dizziness before?" If Yes, ask: "When was the last time?" "What happened that time?"     No 10. OTHER SYMPTOMS: "Do you have any other symptoms?" (e.g., fever, chest pain, vomiting, diarrhea, bleeding)       No 11. PREGNANCY: "Is there any chance you are pregnant?" "When was your last menstrual period?"       n/a  Protocols used:  DIZZINESS [facility]

## 2020-11-16 ENCOUNTER — Ambulatory Visit
Admission: RE | Admit: 2020-11-16 | Discharge: 2020-11-16 | Disposition: A | Discharge status: Home / Self Care | Payer: BC Managed Care – PPO | Source: Ambulatory Visit | Attending: Emergency Medicine | Admitting: Emergency Medicine

## 2020-11-16 ENCOUNTER — Other Ambulatory Visit: Payer: Self-pay

## 2020-11-16 VITALS — BP 128/85 | HR 90 | Temp 98.9°F | Resp 16

## 2020-11-16 DIAGNOSIS — R5383 Other fatigue: Secondary | ICD-10-CM | POA: Diagnosis present

## 2020-11-16 DIAGNOSIS — R42 Dizziness and giddiness: Secondary | ICD-10-CM | POA: Diagnosis not present

## 2020-11-16 NOTE — ED Provider Notes (Signed)
Shaun Holland    CSN: [id number] Arrival date & time: 11/16/20  1337      History   Chief Complaint Chief Complaint  Patient presents with  . Dizziness    HPI Shaun Holland is a 55 y.o. male.   Patient presents with 1 day history of dizziness, headache, fatigue.  He describes the dizziness as feeling off balance or lightheaded.  Very little sensation of spinning.  Treatment attempted at home with ibuprofen.  He denies focal weakness, numbness, fever, chills, sore throat, chest pain, cough, shortness of breath, vomiting, diarrhea, or other symptoms.  His medical history includes hyperlipidemia, former smoker.  Patient states he has an appointment scheduled with his PCP tomorrow.  The history is provided by the patient and medical records.    Past Medical History:  Diagnosis Date  . History of chicken pox   . History of mumps     Patient Active Problem List   Diagnosis Date Noted  . Hemorrhoids 12/13/2015  . Umbilical hernia 12/13/2015  . 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 Hyperlipidemia, mixed 02/01/2009  . Fam hx-ischem heart disease 01/31/2009  . Tobacco use disorder 07/01/1978    Past Surgical History:  Procedure Laterality Date  . Echocardiogram  01/13/2008   [facility]; Normal LVF EF= 66%  . Myocardial Perfusion Scan  01/13/2008   Done by Dr. Juliann Pares. Normal perfusion on function       Home Medications    Prior to Admission medications   Medication Sig Start Date End Date Taking? Authorizing Provider  amoxicillin (AMOXIL) 500 MG capsule Take 1 capsule (500 mg total) by mouth 2 (two) times daily. 09/06/18   Lutricia Feil, PA-C  benzonatate (TESSALON) 200 MG capsule Take one cap TID PRN cough 09/06/18   Lutricia Feil, PA-C    Family History Family History  Problem Relation Age of Onset  . Diabetes Father        type 2  . Hypertension Father   . Hyperlipidemia Father   . Stroke Father   . Heart attack Other   . Heart attack  Other        in his 35's    Social History Social History   Tobacco Use  . Smoking status: Former Smoker    Packs/day: 0.75    Years: 22.00    Pack years: 16.50    Types: Cigarettes    Quit date: 01/29/2014    Years since quitting: 6.8  . Smokeless tobacco: Never Used  Substance Use Topics  . Alcohol use: Yes    Alcohol/week: 0.0 standard drinks    Comment: socially  . Drug use: No     Allergies   Patient has no known allergies.   Review of Systems Review of Systems  Constitutional: Positive for fatigue. Negative for chills and fever.  HENT: Negative for congestion, ear pain and sore throat.   Eyes: Negative for pain and visual disturbance.  Respiratory: Negative for cough and shortness of breath.   Cardiovascular: Negative for chest pain and palpitations.  Gastrointestinal: Negative for abdominal pain and vomiting.  Genitourinary: Negative for dysuria and hematuria.  Musculoskeletal: Negative for arthralgias, back pain and neck pain.  Skin: Negative for color change and rash.  Neurological: Positive for dizziness, light-headedness and headaches. Negative for tremors, seizures, syncope, facial asymmetry, speech difficulty, weakness and numbness.  All other systems reviewed and are negative.    Physical Exam Triage Vital Signs ED Triage Vitals [11/16/20 1342]  Enc Vitals  Group     BP      Pulse      Resp      Temp      Temp src      SpO2      Weight      Height      Head Circumference      Peak Flow      Pain Score 0     Pain Loc      Pain Edu?      Excl. in GC?    No data found.  Updated Vital Signs BP 128/85 (BP Location: Left Arm)   Pulse 90   Temp 98.9 F (37.2 C) (Oral)   Resp 16   SpO2 96%   Visual Acuity Right Eye Distance:   Left Eye Distance:   Bilateral Distance:    Right Eye Near:   Left Eye Near:    Bilateral Near:     Physical Exam Vitals and nursing note reviewed.  Constitutional:      General: He is not in acute  distress.    Appearance: He is well-developed. He is not ill-appearing.  HENT:     Head: Normocephalic and atraumatic.     Right Ear: Tympanic membrane normal.     Left Ear: Tympanic membrane normal.     Nose: Nose normal.     Mouth/Throat:     Mouth: Mucous membranes are moist.     Pharynx: Oropharynx is clear.  Eyes:     Conjunctiva/sclera: Conjunctivae normal.  Cardiovascular:     Rate and Rhythm: Normal rate and regular rhythm.     Heart sounds: Normal heart sounds.  Pulmonary:     Effort: Pulmonary effort is normal. No respiratory distress.     Breath sounds: Normal breath sounds.  Abdominal:     Palpations: Abdomen is soft.     Tenderness: There is no abdominal tenderness.  Musculoskeletal:     Cervical back: Neck supple.  Skin:    General: Skin is warm and dry.  Neurological:     General: No focal deficit present.     Mental Status: He is alert and oriented to person, place, and time.     Cranial Nerves: No cranial nerve deficit.     Sensory: No sensory deficit.     Motor: No weakness.     Coordination: Romberg sign negative.     Gait: Gait normal.  Psychiatric:        Mood and Affect: Mood normal.        Behavior: Behavior normal.      UC Treatments / Results  Labs (all labs ordered are listed, but only abnormal results are displayed) Labs Reviewed  NOVEL CORONAVIRUS, NAA    EKG   Radiology No results found.  Procedures Procedures (including critical care time)  Medications Ordered in UC Medications - No data to display  Initial Impression / Assessment and Plan / UC Course  I have reviewed the triage vital signs and the nursing notes.  Pertinent labs & imaging results that were available during my care of the patient were reviewed by me and considered in my medical decision making (see chart for details).   Dizziness, fatigue.  Patient is well-appearing, vital signs are stable, exam is reassuring.  EKG shows sinus rhythm, rate 84, no ST  elevation, no previous to compare. PCR COVID pending.  Instructed patient to self quarantine until the test result is back.  Discussed symptomatic treatment including Tylenol  or ibuprofen, rest, hydration.  Instructed patient to follow up with PCP tomorrow as scheduled.  Strict ED precautions discussed.  Patient agrees to plan of care.     Final Clinical Impressions(s) / UC Diagnoses   Final diagnoses:  Dizziness  Fatigue, unspecified type     Discharge Instructions     Your COVID test is pending.  You should self quarantine until the test result is back.    Take Tylenol or ibuprofen as needed for fever or discomfort.  Rest and keep yourself hydrated.    Follow-up with your primary care provider as scheduled tomorrow.    Go to the emergency department if you have acute worsening symptoms.          ED Prescriptions    None     PDMP not reviewed this encounter.   Mickie Bail, NP 11/16/20 1416

## 2020-11-16 NOTE — Discharge Instructions (Signed)
Your COVID test is pending.  You should self quarantine until the test result is back.    Take Tylenol or ibuprofen as needed for fever or discomfort.  Rest and keep yourself hydrated.    Follow-up with your primary care provider as scheduled tomorrow.    Go to the emergency department if you have acute worsening symptoms.

## 2020-11-16 NOTE — ED Triage Notes (Signed)
Patient c/o dizziness x  1 day.   Patient states symptoms started suddenly yesterday.   Patient endorses a headache that occurred last night.   Patient denies Chest Pain, SOB, N/V,Fall, or Trauma.   Patient endorses fatigue.   Patient took Ibuprofen for headache w/ some relief of headache.

## 2020-11-17 ENCOUNTER — Encounter: Payer: Self-pay | Admitting: Family Medicine

## 2020-11-17 ENCOUNTER — Ambulatory Visit (INDEPENDENT_AMBULATORY_CARE_PROVIDER_SITE_OTHER): Payer: BC Managed Care – PPO | Admitting: Family Medicine

## 2020-11-17 DIAGNOSIS — Z8249 Family history of ischemic heart disease and other diseases of the circulatory system: Secondary | ICD-10-CM | POA: Diagnosis not present

## 2020-11-17 DIAGNOSIS — Z9189 Other specified personal risk factors, not elsewhere classified: Secondary | ICD-10-CM

## 2020-11-17 DIAGNOSIS — H8110 Benign paroxysmal vertigo, unspecified ear: Secondary | ICD-10-CM

## 2020-11-17 DIAGNOSIS — E782 Mixed hyperlipidemia: Secondary | ICD-10-CM

## 2020-11-17 DIAGNOSIS — Z125 Encounter for screening for malignant neoplasm of prostate: Secondary | ICD-10-CM | POA: Diagnosis not present

## 2020-11-17 LAB — NOVEL CORONAVIRUS, NAA: SARS-CoV-2, NAA: NOT DETECTED

## 2020-11-17 LAB — SARS-COV-2, NAA 2 DAY TAT

## 2020-11-17 MED ORDER — MECLIZINE HCL 25 MG PO TABS: 25.0000 mg | ORAL_TABLET | Freq: Three times a day (TID) | ORAL | 0 refills | Status: DC | PRN

## 2020-11-17 NOTE — Progress Notes (Signed)
Virtual telephone visit    Virtual Visit via Telephone Note   This visit type was conducted due to national recommendations for restrictions regarding the COVID-19 Pandemic (e.g. social distancing) in an effort to limit this patient's exposure and mitigate transmission in our community. Due to his co-morbid illnesses, this patient is at least at moderate risk for complications without adequate follow up. This format is felt to be most appropriate for this patient at this time. The patient did not have access to video technology or had technical difficulties with video requiring transitioning to audio format only (telephone). Physical exam was limited to content and character of the telephone converstion.    Patient location: home Provider location: [facility]  I discussed the limitations of evaluation and management by telemedicine and the availability of in person appointments. The patient expressed understanding and agreed to proceed.   Visit Date: 11/17/2020  Today's healthcare provider: Mila Merry, MD   Chief Complaint  Patient presents with  . Dizziness   Subjective    Dizziness This is a new problem. Episode onset: 2 days ago. The problem occurs constantly. The problem has been unchanged. Associated symptoms include fatigue and headaches. Pertinent negatives include no abdominal pain, chest pain, chills, fever, nausea, vomiting or weakness. The symptoms are aggravated by standing. He has tried nothing for the symptoms.    Patient was seen at an urgent care yesterday for an evaluation of dizziness. EKG was done and the results were normal. PCR COVID test was done and the results are still pending.   Feels a little light headed when sitting, gets worse after he stands ups. Slight spinning sensation when turning head.    He is also concerned about strong family history of cardiovascular disease and would like to see Dr. Darrold Junker for evaluation.   family history includes Diabetes  in his father; Heart attack in some other family members; Hyperlipidemia in his father; Hypertension in his father; Stroke in his father.     Medications: Outpatient Medications Prior to Visit  Medication Sig  . [DISCONTINUED] amoxicillin (AMOXIL) 500 MG capsule Take 1 capsule (500 mg total) by mouth 2 (two) times daily. (Patient not taking: Reported on 11/17/2020)  . [DISCONTINUED] benzonatate (TESSALON) 200 MG capsule Take one cap TID PRN cough (Patient not taking: Reported on 11/17/2020)   No facility-administered medications prior to visit.    Review of Systems  Constitutional: Positive for fatigue. Negative for appetite change, chills and fever.  Respiratory: Negative for chest tightness, shortness of breath and wheezing.   Cardiovascular: Negative for chest pain and palpitations.  Gastrointestinal: Negative for abdominal pain, nausea and vomiting.  Neurological: Positive for dizziness and headaches. Negative for weakness.      Objective    There were no vitals taken for this visit.   Awake, alert, oriented x 3. In no apparent distress   Assessment & Plan     1. Benign paroxysmal positional vertigo, unspecified laterality Is starting to improve.  - meclizine (ANTIVERT) 25 MG tablet; Take 1 tablet (25 mg total) by mouth 3 (three) times daily as needed for dizziness.  Dispense: 30 tablet; Refill: 0  Call if not much better next week.   2. Hyperlipidemia, mixed  - CBC - Lipid panel - Comprehensive metabolic panel  3. Prostate cancer screening  - PSA  4. Fam hx-ischem heart disease He requests to see Dr. Darrold Junker for cardiac risk assessment.   - Ambulatory referral to Cardiology  5. At increased risk for  cardiovascular disease     I discussed the assessment and treatment plan with the patient. The patient was provided an opportunity to ask questions and all were answered. The patient agreed with the plan and demonstrated an understanding of the instructions.    The patient was advised to call back or seek an in-person evaluation if the symptoms worsen or if the condition fails to improve as anticipated.  I provided 12 minutes of non-face-to-face time during this encounter.  The entirety of the information documented in the History of Present Illness, Review of Systems and Physical Exam were personally obtained by me. Portions of this information were initially documented by the CMA and reviewed by me for thoroughness and accuracy.     Mila Merry, MD [facility] [phone] (phone) [phone] (fax)  [facility]

## 2020-11-17 NOTE — Patient Instructions (Signed)
. Please go to the lab draw station in [address] on the second floor of [facility]  when you are fasting for 8 hours. Normal hours are 8:00am to 11:30am and 1:00pm to 4:00pm Monday through Friday    Benign Positional Vertigo Vertigo is the feeling that you or your surroundings are moving when they are not. Benign positional vertigo is the most common form of vertigo. This is usually a harmless condition (benign). This condition is positional. This means that symptoms are triggered by certain movements and positions. This condition can be dangerous if it occurs while you are doing something that could cause harm to you or others. This includes activities such as driving or operating machinery. What are the causes? The inner ear has fluid-filled canals that help your brain sense movement and balance. When the fluid moves, the brain receives messages about your body's position. With benign positional vertigo, crystals in the inner ear break free and disturb the inner ear area. This causes your brain to receive confusing messages about your body's position. What increases the risk? You are more likely to develop this condition if:  You are a woman.  You are 69 years of age or older.  You have recently had a head injury.  You have an inner ear disease. What are the signs or symptoms? Symptoms of this condition usually happen when you move your head or your eyes in different directions. Symptoms may start suddenly, and usually last for less than a minute. They include:  Loss of balance and falling.  Feeling like you are spinning or moving.  Feeling like your surroundings are spinning or moving.  Nausea and vomiting.  Blurred vision.  Dizziness.  Involuntary eye movement (nystagmus). Symptoms can be mild and cause only minor problems, or they can be severe and interfere with daily life. Episodes of benign positional vertigo may return (recur) over time. Symptoms may  improve over time. How is this diagnosed? This condition may be diagnosed based on:  Your medical history.  Physical exam of the head, neck, and ears.  Positional tests to check for or stimulate vertigo. You may be asked to turn your head and change positions, such as going from sitting to lying down. A health care provider will watch for symptoms of vertigo. You may be referred to a health care provider who specializes in ear, nose, and throat problems (ENT, or otolaryngologist) or a provider who specializes in disorders of the nervous system (neurologist). How is this treated? This condition may be treated in a session in which your health care provider moves your head in specific positions to help the displaced crystals in your inner ear move. Treatment for this condition may take several sessions. Surgery may be needed in severe cases, but this is rare. In some cases, benign positional vertigo may resolve on its own in 2-4 weeks.   Follow these instructions at home: Safety  Move slowly. Avoid sudden body or head movements or certain positions, as told by your health care provider.  Avoid driving until your health care provider says it is safe for you to do so.  Avoid operating heavy machinery until your health care provider says it is safe for you to do so.  Avoid doing any tasks that would be dangerous to you or others if vertigo occurs.  If you have trouble walking or keeping your balance, try using a cane for stability. If you feel dizzy or unstable, sit down right away.  Return to your normal activities as told by your health care provider. Ask your health care provider what activities are safe for you. General instructions  Take over-the-counter and prescription medicines only as told by your health care provider.  Drink enough fluid to keep your urine pale yellow.  Keep all follow-up visits as told by your health care provider. This is important. Contact a health care  provider if:  You have a fever.  Your condition gets worse or you develop new symptoms.  Your family or friends notice any behavioral changes.  You have nausea or vomiting that gets worse.  You have numbness or a prickling and tingling sensation. Get help right away if you:  Have difficulty speaking or moving.  Are always dizzy.  Faint.  Develop severe headaches.  Have weakness in your legs or arms.  Have changes in your hearing or vision.  Develop a stiff neck.  Develop sensitivity to light. Summary  Vertigo is the feeling that you or your surroundings are moving when they are not. Benign positional vertigo is the most common form of vertigo.  This condition is caused by crystals in the inner ear that become displaced. This causes a disturbance in an area of the inner ear that helps your brain sense movement and balance.  Symptoms include loss of balance and falling, feeling that you or your surroundings are moving, nausea and vomiting, and blurred vision.  This condition can be diagnosed based on symptoms, a physical exam, and positional tests.  Follow safety instructions as told by your health care provider. You will also be told when to contact your health care provider in case of problems. This information is not intended to replace advice given to you by your health care provider. Make sure you discuss any questions you have with your health care provider. Document Revised: 05/11/2019 Document Reviewed: 11/26/2017 Elsevier Patient Education  2021 [employer].

## 2020-12-05 LAB — COMPREHENSIVE METABOLIC PANEL
ALT: 20 IU/L (ref 0–44)
AST: 15 IU/L (ref 0–40)
Albumin/Globulin Ratio: 1.6 (ref 1.2–2.2)
Albumin: 4.5 g/dL (ref 3.8–4.9)
Alkaline Phosphatase: 43 IU/L — ABNORMAL LOW (ref 44–121)
BUN/Creatinine Ratio: 23 — ABNORMAL HIGH (ref 9–20)
BUN: 22 mg/dL (ref 6–24)
Bilirubin Total: 0.3 mg/dL (ref 0.0–1.2)
CO2: 21 mmol/L (ref 20–29)
Calcium: 9.5 mg/dL (ref 8.7–10.2)
Chloride: 107 mmol/L — ABNORMAL HIGH (ref 96–106)
Creatinine, Ser: 0.94 mg/dL (ref 0.76–1.27)
Globulin, Total: 2.8 g/dL (ref 1.5–4.5)
Glucose: 101 mg/dL — ABNORMAL HIGH (ref 65–99)
Potassium: 4.8 mmol/L (ref 3.5–5.2)
Sodium: 143 mmol/L (ref 134–144)
Total Protein: 7.3 g/dL (ref 6.0–8.5)
eGFR: 97 mL/min/{1.73_m2} (ref >59)

## 2020-12-05 LAB — LIPID PANEL
Chol/HDL Ratio: 5 ratio (ref 0.0–5.0)
Cholesterol, Total: 216 mg/dL — ABNORMAL HIGH (ref 100–199)
HDL: 43 mg/dL (ref >39)
LDL Chol Calc (NIH): 148 mg/dL — ABNORMAL HIGH (ref 0–99)
Triglycerides: 137 mg/dL (ref 0–149)
VLDL Cholesterol Cal: 25 mg/dL (ref 5–40)

## 2020-12-05 LAB — CBC
Hematocrit: 47.3 % (ref 37.5–51.0)
Hemoglobin: 15.7 g/dL (ref 13.0–17.7)
MCH: 28.8 pg (ref 26.6–33.0)
MCHC: 33.2 g/dL (ref 31.5–35.7)
MCV: 87 fL (ref 79–97)
Platelets: 287 10*3/uL (ref 150–450)
RBC: 5.45 x10E6/uL (ref 4.14–5.80)
RDW: 13.2 % (ref 11.6–15.4)
WBC: 7.4 10*3/uL (ref 3.4–10.8)

## 2020-12-05 LAB — PSA: Prostate Specific Ag, Serum: 4.2 ng/mL — ABNORMAL HIGH (ref 0.0–4.0)

## 2021-01-02 ENCOUNTER — Telehealth: Payer: Self-pay | Admitting: Family Medicine

## 2021-01-02 DIAGNOSIS — R972 Elevated prostate specific antigen [PSA]: Secondary | ICD-10-CM

## 2021-01-02 NOTE — Telephone Encounter (Signed)
Pt advised.  He states he will come by and get his labs rechecked.   Thanks,   -Vernona Rieger

## 2021-01-02 NOTE — Telephone Encounter (Signed)
Patient was sent mychart message last month about elevated PSA, but I don't think he every saw it. It's time to recheck the PSA if normal then will not need to do any other follow up. If high will refer to urology. Please advise patient and print order and leave for him at lab.

## 2021-01-17 ENCOUNTER — Encounter: Payer: Self-pay | Admitting: Family Medicine

## 2021-01-17 DIAGNOSIS — R972 Elevated prostate specific antigen [PSA]: Secondary | ICD-10-CM | POA: Insufficient documentation

## 2021-01-20 LAB — FPSA% REFLEX
% FREE PSA: 20 %
PSA, FREE: 0.86 ng/mL

## 2021-01-20 LAB — PSA TOTAL (REFLEX TO FREE): Prostate Specific Ag, Serum: 4.3 ng/mL — ABNORMAL HIGH (ref 0.0–4.0)

## 2021-04-30 ENCOUNTER — Ambulatory Visit
Admission: EM | Admit: 2021-04-30 | Discharge: 2021-04-30 | Disposition: A | Discharge status: Home / Self Care | Payer: BC Managed Care – PPO | Attending: Internal Medicine | Admitting: Internal Medicine

## 2021-04-30 ENCOUNTER — Encounter: Payer: Self-pay | Admitting: Emergency Medicine

## 2021-04-30 ENCOUNTER — Telehealth: Payer: Self-pay

## 2021-04-30 ENCOUNTER — Other Ambulatory Visit: Payer: Self-pay

## 2021-04-30 DIAGNOSIS — Z20822 Contact with and (suspected) exposure to covid-19: Secondary | ICD-10-CM | POA: Insufficient documentation

## 2021-04-30 DIAGNOSIS — Z87891 Personal history of nicotine dependence: Secondary | ICD-10-CM | POA: Insufficient documentation

## 2021-04-30 DIAGNOSIS — J028 Acute pharyngitis due to other specified organisms: Secondary | ICD-10-CM | POA: Insufficient documentation

## 2021-04-30 DIAGNOSIS — J029 Acute pharyngitis, unspecified: Secondary | ICD-10-CM

## 2021-04-30 DIAGNOSIS — B9789 Other viral agents as the cause of diseases classified elsewhere: Secondary | ICD-10-CM | POA: Diagnosis not present

## 2021-04-30 DIAGNOSIS — R0981 Nasal congestion: Secondary | ICD-10-CM | POA: Insufficient documentation

## 2021-04-30 MED ORDER — MOUTHWASH COMPOUNDING BASE PO LIQD: 15.0000 mL | ORAL | 0 refills | Status: DC | PRN

## 2021-04-30 NOTE — Discharge Instructions (Signed)
Please use medications as prescribed Return to urgent care if you have any worsening symptoms

## 2021-04-30 NOTE — ED Triage Notes (Signed)
Pt presents today with c/o nasal congestion, cough and sore throat since Friday. +low grade fever. No meds pta. Home Covid test x 2, both negative. He would like to be tested for Covid today.

## 2021-04-30 NOTE — Telephone Encounter (Signed)
Copied from CRM [id number]. Topic: Appointment Scheduling - Scheduling Inquiry for Clinic >> Apr 30, 2021  9:08 AM Wyonia Hough E wrote: Reason for CRM: Pt has a sore throat/ pt asked to be seen or virtual today / please advise he went to [facility] walk in but there is a two hr wait

## 2021-05-01 LAB — SARS CORONAVIRUS 2 (TAT 6-24 HRS): SARS Coronavirus 2: NEGATIVE

## 2021-05-01 NOTE — Telephone Encounter (Signed)
Left message.   We have an opening right now for a virtual visit.  If he calls back by 10:10am 05/01/2021 please schedule him with Dr. Sherrie Mustache at the 10am.   Thanks,   -Vernona Rieger

## 2021-05-01 NOTE — ED Provider Notes (Signed)
[facility]    CSN: [id number] Arrival date & time: 04/30/21  0913      History   Chief Complaint Chief Complaint  Patient presents with   Sore Throat   Nasal Congestion   Cough    HPI Shaun Holland is a 55 y.o. male comes to the urgent care with a 3-day history of nasal congestion, cough and a sore throat as well as low-grade fever.  Patient's symptoms are persistent.  He comes to the urgent care today requesting COVID testing.  Home COVID test on 2 occasions are negative.  Patient is concerned about the severe throat pain.  He denies any nausea, vomiting or diarrhea.  No generalized body aches.  No sick contacts. 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 Juliann Pares. Normal perfusion on function       Home Medications    Prior to Admission medications   Medication Sig Start Date End Date Taking? Authorizing Provider  Mouthwash Compounding Base LIQD Swish and spit 15 mLs as needed. Compounding formula: Maalox 80 mL, Benadryl 12.5 mg / 5 mL 80 mL, lidocaine 2% viscous 80 mL 04/30/21  Yes Annaliza Zia, Britta Mccreedy, MD  meclizine (ANTIVERT) 25 MG tablet Take 1 tablet (25 mg total) by mouth 3 (three) times daily as needed for dizziness. 11/17/20   Malva Limes, MD    Family History Family History  Problem Relation Age of Onset   Diabetes Father        type 2   Hypertension Father    Hyperlipidemia Father    Stroke Father    Heart attack Other    Heart  attack Other        in his 85's    Social History Social History   Tobacco Use   Smoking status: Former    Packs/day: 0.75    Years: 22.00    Pack years: 16.50    Types: Cigarettes    Quit date: 01/29/2014    Years since quitting: 7.2   Smokeless tobacco: Never  Vaping Use   Vaping Use: Never used  Substance Use Topics   Alcohol use: Yes    Alcohol/week: 0.0 standard drinks    Comment: socially   Drug use: No     Allergies   Patient has no known allergies.   Review of Systems Review of Systems  Constitutional:  Positive for fever.  HENT:  Positive for congestion and sore throat.   Respiratory:  Positive for cough.     Physical Exam Triage Vital Signs ED Triage Vitals  Enc Vitals Group     BP 04/30/21 1040 (!) 135/99     Pulse Rate 04/30/21 1040 72     Resp 04/30/21 1040 18     Temp 04/30/21 1040 97.8 F (36.6 C)     Temp Source 04/30/21 1040 Oral     SpO2 04/30/21 1040 99 %     Weight --  Height --      Head Circumference --      Peak Flow --      Pain Score 04/30/21 1038 3     Pain Loc --      Pain Edu? --      Excl. in GC? --    No data found.  Updated Vital Signs BP (!) 135/99 (BP Location: Left Arm)   Pulse 72   Temp 97.8 F (36.6 C) (Oral)   Resp 18   SpO2 99%   Visual Acuity Right Eye Distance:   Left Eye Distance:   Bilateral Distance:    Right Eye Near:   Left Eye Near:    Bilateral Near:     Physical Exam Vitals and nursing note reviewed.  Constitutional:      General: He is not in acute distress.    Appearance: He is not ill-appearing or diaphoretic.  HENT:     Right Ear: Tympanic membrane normal.     Left Ear: Tympanic membrane normal.     Nose: No rhinorrhea.     Mouth/Throat:     Mouth: Mucous membranes are moist.     Pharynx: No posterior oropharyngeal erythema.     Tonsils: No tonsillar exudate or tonsillar abscesses. 0 on the right. 0 on the left.  Cardiovascular:     Rate and Rhythm: Normal rate and regular  rhythm.  Neurological:     Mental Status: He is alert.     UC Treatments / Results  Labs (all labs ordered are listed, but only abnormal results are displayed) Labs Reviewed  SARS CORONAVIRUS 2 (TAT 6-24 HRS)    EKG   Radiology No results found.  Procedures Procedures (including critical care time)  Medications Ordered in UC Medications - No data to display  Initial Impression / Assessment and Plan / UC Course  I have reviewed the triage vital signs and the nursing notes.  Pertinent labs & imaging results that were available during my care of the patient were reviewed by me and considered in my medical decision making (see chart for details).     1.  Acute viral pharyngitis: Warm salt water gargle Mouthwash to gargle as needed for throat pain No indication for COVID testing Maintain adequate hydration. Return to urgent care if symptoms worsen. Final Clinical Impressions(s) / UC Diagnoses   Final diagnoses:  Acute viral pharyngitis     Discharge Instructions      Please use medications as prescribed Return to urgent care if you have any worsening symptoms   ED Prescriptions     Medication Sig Dispense Auth. Provider   Mouthwash Compounding Base LIQD Swish and spit 15 mLs as needed. Compounding formula: Maalox 80 mL, Benadryl 12.5 mg / 5 mL 80 mL, lidocaine 2% viscous 80 mL 240 mL Neysa Arts, Britta Mccreedy, MD      PDMP not reviewed this encounter.   Merrilee Jansky, MD 05/01/21 [phone]

## 2021-06-19 ENCOUNTER — Telehealth: Payer: Self-pay | Admitting: Family Medicine

## 2021-06-19 DIAGNOSIS — R972 Elevated prostate specific antigen [PSA]: Secondary | ICD-10-CM

## 2021-06-19 NOTE — Telephone Encounter (Signed)
Patient was advised. Lab printed.

## 2021-06-19 NOTE — Telephone Encounter (Signed)
Please advise patient it is time to check PSA. Please print order and leave at lab.

## 2021-08-03 ENCOUNTER — Other Ambulatory Visit: Payer: Self-pay | Admitting: Family Medicine

## 2021-08-03 DIAGNOSIS — R972 Elevated prostate specific antigen [PSA]: Secondary | ICD-10-CM

## 2021-08-03 LAB — PSA: Prostate Specific Ag, Serum: 4.6 ng/mL — ABNORMAL HIGH (ref 0.0–4.0)

## 2021-08-10 ENCOUNTER — Ambulatory Visit: Payer: BC Managed Care – PPO | Admitting: Urology

## 2021-08-14 ENCOUNTER — Ambulatory Visit: Payer: BC Managed Care – PPO | Admitting: Urology

## 2021-08-14 ENCOUNTER — Encounter: Payer: Self-pay | Admitting: Urology

## 2021-08-14 ENCOUNTER — Other Ambulatory Visit: Payer: Self-pay

## 2021-08-14 VITALS — BP 135/91 | HR 80 | Ht 74.0 in | Wt 257.0 lb

## 2021-08-14 DIAGNOSIS — R972 Elevated prostate specific antigen [PSA]: Secondary | ICD-10-CM | POA: Diagnosis not present

## 2021-08-14 NOTE — Patient Instructions (Signed)
Prostate Cancer Screening ?Prostate cancer screening is testing that is done to check for the presence of prostate cancer in men. The prostate gland is a walnut-sized gland that is located below the bladder and in front of the rectum in males. The function of the prostate is to add fluid to semen during ejaculation. Prostate cancer is one of the most common types of cancer in men. ?Who should have prostate cancer screening? ?Screening recommendations vary based on age and other risk factors, as well as between the professional organizations who make the recommendations. ?In general, screening is recommended if: ?You are age 50 to 70 and have an average risk for prostate cancer. You should talk with your health care provider about your need for screening and how often screening should be done. Because most prostate cancers are slow growing and will not cause death, screening in this age group is generally reserved for men who have a 10- to 15-year life expectancy. ?You are younger than age 50, and you have these risk factors: ?Having a father, brother, or uncle who has been diagnosed with prostate cancer. The risk is higher if your family member's cancer occurred at an early age or if you have multiple family members with prostate cancer at an early age. ?Being a male who is Black or is of Caribbean or sub-Saharan African descent. ?In general, screening is not recommended if: ?You are younger than age 40. ?You are between the ages of 40 and 49 and you have no risk factors. ?You are 70 years of age or older. At this age, the risks that screening can cause are greater than the benefits that it may provide. ?If you are at high risk for prostate cancer, your health care provider may recommend that you have screenings more often or that you start screening at a younger age. ?How is screening for prostate cancer done? ?The recommended prostate cancer screening test is a blood test called the prostate-specific antigen (PSA)  test. PSA is a protein that is made in the prostate. As you age, your prostate naturally produces more PSA. Abnormally high PSA levels may be caused by: ?Prostate cancer. ?An enlarged prostate that is not caused by cancer (benign prostatic hyperplasia, or BPH). This condition is very common in older men. ?A prostate gland infection (prostatitis) or urinary tract infection. ?Certain medicines such as male hormones (like testosterone) or other medicines that raise testosterone levels. ?A rectal exam may be done as part of prostate cancer screening to help provide information about the size of your prostate gland. When a rectal exam is performed, it should be done after the PSA level is drawn to avoid any effect on the results. ?Depending on the PSA results, you may need more tests, such as: ?A physical exam to check the size of your prostate gland, if not done as part of screening. ?Blood and imaging tests. ?A procedure to remove tissue samples from your prostate gland for testing (biopsy). This is the only way to know for certain if you have prostate cancer. ?What are the benefits of prostate cancer screening? ?Screening can help to identify cancer at an early stage, before symptoms start and when the cancer can be treated more easily. ?There is a small chance that screening may lower your risk of dying from prostate cancer. The chance is small because prostate cancer is a slow-growing cancer, and most men with prostate cancer die from a different cause. ?What are the risks of prostate cancer screening? ?The main   risk of prostate cancer screening is diagnosing and treating prostate cancer that would never have caused any symptoms or problems. This is called overdiagnosisand overtreatment. PSA screening cannot tell you if your PSA is high due to cancer or a different cause. A prostate biopsy is the only procedure to diagnose prostate cancer. Even the results of a biopsy may not tell you if your cancer needs to be  treated. Slow-growing prostate cancer may not need any treatment other than monitoring, so diagnosing and treating it may cause unnecessary stress or other side effects. ?Questions to ask your health care provider ?When should I start prostate cancer screening? ?What is my risk for prostate cancer? ?How often do I need screening? ?What type of screening tests do I need? ?How do I get my test results? ?What do my results mean? ?Do I need treatment? ?Where to find more information ?The American Cancer Society: www.cancer.org ?American Urological Association: www.auanet.org ?Contact a health care provider if: ?You have difficulty urinating. ?You have pain when you urinate or ejaculate. ?You have blood in your urine or semen. ?You have pain in your back or in the area of your prostate. ?Summary ?Prostate cancer is a common type of cancer in men. The prostate gland is located below the bladder and in front of the rectum. This gland adds fluid to semen during ejaculation. ?Prostate cancer screening may identify cancer at an early stage, when the cancer can be treated more easily and is less likely to have spread to other areas of the body. ?The prostate-specific antigen (PSA) test is the recommended screening test for prostate cancer, but it has associated risks. ?Discuss the risks and benefits of prostate cancer screening with your health care provider. If you are age 70 or older, the risks that screening can cause are greater than the benefits that it may provide. ?This information is not intended to replace advice given to you by your health care provider. Make sure you discuss any questions you have with your health care provider. ?Document Revised: 12/11/2020 Document Reviewed: 12/11/2020 ?Elsevier Patient Education ? 2022 Elsevier Inc. ? ?

## 2021-08-14 NOTE — Progress Notes (Signed)
° °  08/14/21 2:55 PM   Shaun Holland [facility] Nov 01, 1965 [id number]  CC: Elevated PSA  HPI: I saw Shaun Holland in clinic today for evaluation of mildly elevated PSA.  Most recent PSA in February 2023 was 4.6, and was 4.3(20% free) in July 2022 and 4.2 in June 2022.  He denies any family history of prostate cancer.  He denies any urinary symptoms or gross hematuria.  He has never undergone prostate biopsy previously.   PMH: Past Medical History:  Diagnosis Date   History of chicken pox    History of mumps     Surgical History: Past Surgical History:  Procedure Laterality Date   Echocardiogram  01/13/2008   [facility]; Normal LVF EF= 66%   Myocardial Perfusion Scan  01/13/2008   Done by Dr. Clayborn Bigness. Normal perfusion on function     Family History: Family History  Problem Relation Age of Onset   Diabetes Father        type 2   Hypertension Father    Hyperlipidemia Father    Stroke Father    Heart attack Other    Heart attack Other        in his 25's    Social History:  reports that he quit smoking about 7 years ago. His smoking use included cigarettes. He has a 16.50 pack-year smoking history. He has never been exposed to tobacco smoke. He has never used smokeless tobacco. He reports current alcohol use. He reports that he does not use drugs.  Physical Exam: BP (!) 135/91    Pulse 80    Ht 6\' 2"  (1.88 m)    Wt 257 lb (116.6 kg)    BMI 33.00 kg/m    Constitutional:  Alert and oriented, No acute distress. Cardiovascular: No clubbing, cyanosis, or edema. Respiratory: Normal respiratory effort, no increased work of breathing. GI: Abdomen is soft, nontender, nondistended, no abdominal masses DRE: 30 g, smooth, no nodules or masses  Assessment & Plan:   Healthy 56 year old male with mildly elevated PSA of 4.6 with reassuring 20% free.  DRE benign today.  We reviewed the implications of an elevated PSA and the uncertainty surrounding it. In general, a man's PSA  increases with age and is produced by both normal and cancerous prostate tissue. The differential diagnosis for elevated PSA includes BPH, prostate cancer, infection, recent intercourse/ejaculation, recent urethroscopic manipulation (foley placement/cystoscopy) or trauma, and prostatitis.   Management of an elevated PSA can include observation or prostate biopsy and we discussed this in detail. Our goal is to detect clinically significant prostate cancers, and manage with either active surveillance, surgery, or radiation for localized disease. Risks of prostate biopsy include bleeding, infection (including life threatening sepsis), pain, and lower urinary symptoms. Hematuria, hematospermia, and blood in the stool are all common after biopsy and can persist up to 4 weeks.   -We discussed options including prostate biopsy, repeat PSA, or prostate MRI.  Risk and benefits discussed extensively, and he opts for a repeat PSA in 4 to 5 months.  If significant rise would recommend prostate biopsy or MRI with his young age and good health.   Nickolas Madrid, MD 08/14/2021  [facility] [address], [place], [place] [address] [phone]

## 2022-01-08 ENCOUNTER — Ambulatory Visit: Payer: BC Managed Care – PPO | Admitting: Urology

## 2022-01-11 ENCOUNTER — Other Ambulatory Visit
Admission: RE | Admit: 2022-01-11 | Discharge: 2022-01-11 | Disposition: A | Discharge status: Home / Self Care | Payer: BC Managed Care – PPO | Attending: Urology | Admitting: Urology

## 2022-01-11 DIAGNOSIS — R972 Elevated prostate specific antigen [PSA]: Secondary | ICD-10-CM

## 2022-01-14 LAB — PSA (REFLEX TO FREE) (SERIAL): Prostate Specific Ag, Serum: 3.9 ng/mL (ref 0.0–4.0)

## 2022-01-15 ENCOUNTER — Encounter: Payer: Self-pay | Admitting: Urology

## 2022-01-15 ENCOUNTER — Ambulatory Visit: Payer: BC Managed Care – PPO | Admitting: Urology

## 2022-01-15 VITALS — BP 146/89 | HR 69 | Ht 74.0 in | Wt 258.0 lb

## 2022-01-15 DIAGNOSIS — Z125 Encounter for screening for malignant neoplasm of prostate: Secondary | ICD-10-CM

## 2022-01-15 DIAGNOSIS — R972 Elevated prostate specific antigen [PSA]: Secondary | ICD-10-CM

## 2022-01-15 NOTE — Progress Notes (Signed)
   01/15/2022 9:55 AM   Westley Hummer 1966-04-27 [id number]  Reason for visit: Follow up elevated PSA  HPI: Healthy 56 year old male with no family history of prostate cancer and no urinary symptoms who was referred for a mildly elevated PSA of 4.6.  He also had a PSA of 4.3 with 20% free in July 2022.  DRE was benign.  Repeat PSA on 01/11/2022 returned to normal at 3.9, and has been very stable over the last year.  We reviewed the AUA guidelines regarding PSA screening and risks and benefits of screening, and he would like to continue yearly PSA.  I did not recommend prostate MRI or prostate biopsy at this time with stable PSA over the last year, and return to normal on most recent PSA, as well as elevated percentage free previously that indicates a benign process more likely.  RTC 1 year PSA reflex to free prior, PVR  Sondra Come, MD  [facility] [address] [place], [place] [address] [phone]

## 2022-01-15 NOTE — Patient Instructions (Signed)

## 2022-01-29 ENCOUNTER — Encounter: Payer: Self-pay | Admitting: Family Medicine

## 2022-02-01 ENCOUNTER — Ambulatory Visit (INDEPENDENT_AMBULATORY_CARE_PROVIDER_SITE_OTHER): Payer: BC Managed Care – PPO | Admitting: Family Medicine

## 2022-02-01 DIAGNOSIS — Z23 Encounter for immunization: Secondary | ICD-10-CM

## 2022-02-01 NOTE — Progress Notes (Signed)
Patient provided with VIS for Shingrix; no complications s/p vaccination. Consent received. No assessment from NP.  Damyia Strider T Wilber Fini, FNP  [facility] [address] [place], [place] [address] [phone] (phone) [phone] (fax) [facility]  

## 2022-12-23 ENCOUNTER — Telehealth: Payer: Self-pay | Admitting: Family Medicine

## 2022-12-23 DIAGNOSIS — M79672 Pain in left foot: Secondary | ICD-10-CM

## 2022-12-23 NOTE — Telephone Encounter (Addendum)
Referral Request - Has patient seen PCP for this complaint? No. patient stated he didn't need an appointment for this, however he does have an upcoming appointment with Dr. Sherrie Mustache on 01/17/2023 for hernia (separate issue)  *If NO, is insurance requiring patient see PCP for this issue before PCP can refer them? No  Referral for which specialty: Podiatry  Preferred provider/office: Gwyneth Revels, DPM -- Podiatrist @ [facility] Office: [phone]  Reason for referral: Left foot pain.  Patient's callback #:  [phone]

## 2022-12-24 NOTE — Telephone Encounter (Signed)
Referral has been submitted

## 2023-01-17 ENCOUNTER — Encounter: Payer: Self-pay | Admitting: Family Medicine

## 2023-01-17 ENCOUNTER — Ambulatory Visit: Payer: BC Managed Care – PPO | Admitting: Family Medicine

## 2023-01-17 VITALS — BP 134/86 | HR 87 | Temp 98.0°F | Resp 12 | Ht 74.0 in | Wt 251.0 lb

## 2023-01-17 DIAGNOSIS — K429 Umbilical hernia without obstruction or gangrene: Secondary | ICD-10-CM | POA: Diagnosis not present

## 2023-01-17 DIAGNOSIS — R972 Elevated prostate specific antigen [PSA]: Secondary | ICD-10-CM

## 2023-01-17 DIAGNOSIS — E782 Mixed hyperlipidemia: Secondary | ICD-10-CM

## 2023-01-17 NOTE — Progress Notes (Signed)
Established patient visit   Patient: Shaun Holland   DOB: [birth year]   57 y.o. Male  MRN: [id number] Visit Date: 01/17/2023  Today's healthcare provider: Mila Merry, MD   Chief Complaint  Patient presents with   Umbilical Hernia   Subjective    Discussed the use of AI scribe software for clinical note transcription with the patient, who gave verbal consent to proceed.  History of Present Illness   The patient presents with a chief complaint of an enlarging umbilical hernia. He reports no associated pain or discomfort but finds the hernia to be aggravating and has noticed an increase in size over time. The patient expresses a desire to have the hernia repaired before his planned retirement due to financial considerations.  The patient has a family history of heart disease, with his father, uncle, and grandfather all having experienced heart problems. Despite this, his own cardiac health appears stable, with a cardiologist visit two years prior with normal stress test.     He is due for a checkup regarding his PSA levels due to history of elevated PSA followed by urology.     Past Surgical History:  Procedure Laterality Date   Echocardiogram  01/13/2008   [facility]; Normal LVF EF= 66%   Myocardial Perfusion Scan  01/13/2008   Done by Dr. Juliann Pares. Normal perfusion on function    Medications: No outpatient medications prior to visit.   No facility-administered medications prior to visit.   Review of Systems  Constitutional:  Negative for appetite change, chills and fever.  Respiratory:  Negative for chest tightness, shortness of breath and wheezing.   Cardiovascular:  Negative for chest pain and palpitations.  Gastrointestinal:  Negative for abdominal pain, nausea and vomiting.     Objective    BP 134/86 (BP Location: Left Arm, Patient Position: Sitting, Cuff Size: Large)   Pulse 87   Temp 98 F (36.7 C) (Temporal)   Resp 12   Ht 6\' 2"  (1.88 m)   Wt  251 lb (113.9 kg)   BMI 32.23 kg/m     Physical Exam   General: Appearance:    Mildly obese male in no acute distress  Eyes:    PERRL, conjunctiva/corneas clear, EOM's intact       Lungs:     Clear to auscultation bilaterally, respirations unlabored  Heart:    Normal heart rate. Normal rhythm. No murmurs, rubs, or gallops.    MS:   All extremities are intact.    Neurologic:   Awake, alert, oriented x 3. No apparent focal neurological defect.       EKG: Normal rhythm, leftward axis, othersise normal ECG  Assessment & Plan     Assessment and Plan    Umbilical Hernia: Increasing in size, no associated pain. 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 -Refer to [facility] for surgical consultation. -He is at low risk for complication from surgery and anesthesia and is medically optimized for possible surgical repair of umbilical hernia.   Family History of Heart Disease: Father, uncle, and grandfather with heart disease. Patient has no personal history of cardiac symptoms. Last cardiology evaluation 2.5 years ago was normal. -Continue monitoring, no immediate intervention required.  Plantar Fasciitis: Managed by Dr. Ether Griffins with anti-inflammatory medication and stretching exercises. -Continue current management plan.  Prostate Health: PSA was previously elevated but is now under 4. Patient has a follow-up appointment on 01/21/2023. -Perform routine blood work today  and send a copy to the provider managing the patient's prostate health.        Mila Merry, MD  [facility] [phone] (phone) [phone] (fax)  [facility]

## 2023-01-18 LAB — CBC
MCH: 29.6 pg (ref 26.6–33.0)
MCV: 87 fL (ref 79–97)
RBC: 5.54 x10E6/uL (ref 4.14–5.80)
RDW: 13.2 % (ref 11.6–15.4)

## 2023-01-18 LAB — FPSA% REFLEX: PSA, FREE: 1.23 ng/mL

## 2023-01-18 LAB — COMPREHENSIVE METABOLIC PANEL
Alkaline Phosphatase: 50 IU/L (ref 44–121)
Bilirubin Total: 0.7 mg/dL (ref 0.0–1.2)
Calcium: 10 mg/dL (ref 8.7–10.2)
Chloride: 104 mmol/L (ref 96–106)
Globulin, Total: 2.8 g/dL (ref 1.5–4.5)
Potassium: 5.1 mmol/L (ref 3.5–5.2)
Sodium: 142 mmol/L (ref 134–144)
eGFR: 79 mL/min/{1.73_m2} (ref >59)

## 2023-01-18 LAB — LIPID PANEL
Chol/HDL Ratio: 5 ratio (ref 0.0–5.0)
Cholesterol, Total: 236 mg/dL — ABNORMAL HIGH (ref 100–199)
HDL: 47 mg/dL (ref >39)
LDL Chol Calc (NIH): 168 mg/dL — ABNORMAL HIGH (ref 0–99)
VLDL Cholesterol Cal: 21 mg/dL (ref 5–40)

## 2023-01-21 ENCOUNTER — Encounter: Payer: Self-pay | Admitting: Urology

## 2023-01-21 ENCOUNTER — Ambulatory Visit: Payer: BC Managed Care – PPO | Admitting: Urology

## 2023-01-21 ENCOUNTER — Other Ambulatory Visit: Payer: Self-pay | Admitting: Family Medicine

## 2023-01-21 VITALS — BP 134/86 | Ht 74.0 in | Wt 251.0 lb

## 2023-01-21 DIAGNOSIS — R972 Elevated prostate specific antigen [PSA]: Secondary | ICD-10-CM

## 2023-01-21 DIAGNOSIS — E782 Mixed hyperlipidemia: Secondary | ICD-10-CM

## 2023-01-21 LAB — APOLIPOPROTEIN B: Apolipoprotein B: 127 mg/dL — ABNORMAL HIGH (ref <90)

## 2023-01-21 LAB — COMPREHENSIVE METABOLIC PANEL
ALT: 22 IU/L (ref 0–44)
AST: 17 IU/L (ref 0–40)
Albumin: 4.8 g/dL (ref 3.8–4.9)
BUN/Creatinine Ratio: 16 (ref 9–20)
BUN: 18 mg/dL (ref 6–24)
CO2: 23 mmol/L (ref 20–29)
Creatinine, Ser: 1.1 mg/dL (ref 0.76–1.27)
Glucose: 99 mg/dL (ref 70–99)
Total Protein: 7.6 g/dL (ref 6.0–8.5)

## 2023-01-21 LAB — CBC
Hematocrit: 48 % (ref 37.5–51.0)
Hemoglobin: 16.4 g/dL (ref 13.0–17.7)
MCHC: 34.2 g/dL (ref 31.5–35.7)
Platelets: 296 10*3/uL (ref 150–450)
WBC: 9.1 10*3/uL (ref 3.4–10.8)

## 2023-01-21 LAB — FPSA% REFLEX: % FREE PSA: 24.1 %

## 2023-01-21 LAB — PSA TOTAL (REFLEX TO FREE): Prostate Specific Ag, Serum: 5.1 ng/mL — ABNORMAL HIGH (ref 0.0–4.0)

## 2023-01-21 LAB — LIPOPROTEIN A (LPA): Lipoprotein (a): 21.1 nmol/L (ref <75.0)

## 2023-01-21 LAB — LIPID PANEL: Triglycerides: 118 mg/dL (ref 0–149)

## 2023-01-21 MED ORDER — ROSUVASTATIN CALCIUM 10 MG PO TABS: 10.0000 mg | ORAL_TABLET | Freq: Every day | ORAL | 3 refills | Status: DC

## 2023-01-21 NOTE — Progress Notes (Signed)
   01/21/2023 8:48 AM   Shaun Holland 08-Aug-1965 [id number]  Reason for visit: Follow up elevated PSA  HPI: Healthy 57 year old male with no family history of prostate cancer and no urinary symptoms who was originally referred February 2023 for a mildly elevated PSA of 4.6.  He also had a PSA of 4.3 with 20% free in July 2022.  DRE was benign.  Repeat PSA on 01/11/2022 returned to normal at 3.9.  Most recent PSA from 01/17/2023 was 5.1, with reassuring 24% free.  We do long conversation about the AUA guidelines regarding the risks and benefits of PSA screening, as well as his overall reassuring trend that has ranged from 3.9-5.1 over the last 2 to 3 years.  We reviewed other options including prostate MRI or prostate biopsy, using shared decision making he opted for repeat PSA with reflex to free in 1 year which I think is very reasonable.  We discussed the very low, but not 0, risk of missing a clinically significant prostate cancer by deferring MRI or biopsy at this time.  RTC 1 year PSA reflex to free prior.  Consider prostate MRI in the future if PSA rises or percentage free decreases  Sondra Come, MD  [facility] [address], [address] [place], [place] [address] [phone]

## 2023-01-21 NOTE — Patient Instructions (Signed)

## 2023-01-21 NOTE — Addendum Note (Signed)
Addended by: Frankey Shown on: 01/21/2023 08:51 AM   Modules accepted: Orders

## 2023-01-23 ENCOUNTER — Encounter: Payer: Self-pay | Admitting: Family Medicine

## 2023-01-29 NOTE — Progress Notes (Unsigned)
Patient ID: Shaun Holland, male   DOB: 05-27-1966, 57 y.o.   MRN: [id number]  Chief Complaint: Umbilical hernia  History of Present Illness Shaun Holland is a 57 y.o. male with an umbilical hernia present for more than 10 years.  Patient reports a gradual increase in size.  Previously able to fully reduce it.  Denies pain.  Desires to lose some weight and exercise more frequently.  Anticipating potential complicating issues should he initiate an exercise regimen while unrepaired.  No prior abdominal surgeries.    Past Medical History Past Medical History:  Diagnosis Date   History of chicken pox    History of mumps       Past Surgical History:  Procedure Laterality Date   Echocardiogram  01/13/2008   [facility]; Normal LVF EF= 66%   Myocardial Perfusion Scan  01/13/2008   Done by Dr. Juliann Pares. Normal perfusion on function    No Known Allergies  Current Outpatient Medications  Medication Sig Dispense Refill   celecoxib (CELEBREX) 200 MG capsule Take 200 mg by mouth daily.     rosuvastatin (CRESTOR) 10 MG tablet Take 1 tablet (10 mg total) by mouth daily. 30 tablet 3   No current facility-administered medications for this visit.    Family History Family History  Problem Relation Age of Onset   Diabetes Father        type 2   Hypertension Father    Hyperlipidemia Father    Stroke Father    Heart attack Other    Heart attack Other        in his 56's      Social History Social History   Tobacco Use   Smoking status: Former    Current packs/day: 0.00    Average packs/day: 0.8 packs/day for 22.0 years (16.5 ttl pk-yrs)    Types: Cigarettes    Start date: 01/30/1992    Quit date: 01/29/2014    Years since quitting: 9.0    Passive exposure: Never   Smokeless tobacco: Never  Vaping Use   Vaping status: Never Used  Substance Use Topics   Alcohol use: Yes    Alcohol/week: 0.0 standard drinks of alcohol    Comment: socially   Drug use: No         Review of Systems  Constitutional: Negative.   HENT: Negative.    Eyes: Negative.   Respiratory: Negative.    Cardiovascular: Negative.   Gastrointestinal: Negative.   Genitourinary: Negative.   Skin: Negative.   Neurological: Negative.   Psychiatric/Behavioral: Negative.       Physical Exam Blood pressure (!) 152/97, pulse 72, temperature 97.8 F (36.6 C), height 6\' 2"  (1.88 m), weight 254 lb 12.8 oz (115.6 kg), SpO2 96%. Last Weight  Most recent update: 01/30/2023  9:16 AM    Weight  115.6 kg (254 lb 12.8 oz)             CONSTITUTIONAL: Well developed, and nourished, appropriately responsive and aware without distress.   EYES: Sclera non-icteric.   EARS, NOSE, MOUTH AND THROAT:  The oropharynx is clear. Oral mucosa is pink and moist.    Hearing is intact to voice.  NECK: Trachea is midline, and there is no jugular venous distension.  LYMPH NODES:  Lymph nodes in the neck are not appreciated. RESPIRATORY:  Lungs are clear, and breath sounds are equal bilaterally.  Normal respiratory effort without pathologic use of accessory muscles. CARDIOVASCULAR: Heart is regular in rate and rhythm.  Well perfused.  GI: The abdomen is notable for a significant skin stretching, slightly purplish hue, palpable mass consistent with omental tissues.  Nontender, unable to reduce though attempts are well-tolerated.  Palpation of the immediate fascial ring somewhat tender, otherwise soft, nontender, and nondistended. There were no palpable masses.  I believe the diameter of the fascial defect is less than 3 cm guesstimating on circumference.  I did not appreciate hepatosplenomegaly. MUSCULOSKELETAL:  Symmetrical muscle tone appreciated in all four extremities.    SKIN: Skin turgor is normal. No pathologic skin lesions appreciated.  NEUROLOGIC:  Motor and sensation appear grossly normal.  Cranial nerves are grossly without defect. PSYCH:  Alert and oriented to person, place and time. Affect is  appropriate for situation.  Data Reviewed I have personally reviewed what is currently available of the patient's imaging, recent labs and medical records.   Labs:     Latest Ref Rng & Units 01/17/2023   10:30 AM 12/04/2020    8:11 AM  CBC  WBC 3.4 - 10.8 x10E3/uL 9.1  7.4   Hemoglobin 13.0 - 17.7 g/dL 16.1  09.6   Hematocrit 37.5 - 51.0 % 48.0  47.3   Platelets 150 - 450 x10E3/uL 296  287       Latest Ref Rng & Units 01/17/2023   10:30 AM 12/04/2020    8:11 AM 05/22/2011   12:00 AM  CMP  Glucose 70 - 99 mg/dL 99  045    BUN 6 - 24 mg/dL 18  22    Creatinine 4.09 - 1.27 mg/dL 8.11  9.14    Sodium 782 - 144 mmol/L 142  143    Potassium 3.5 - 5.2 mmol/L 5.1  4.8    Chloride 96 - 106 mmol/L 104  107    CO2 20 - 29 mmol/L 23  21    Calcium 8.7 - 10.2 mg/dL 95.6  9.5    Total Protein 6.0 - 8.5 g/dL 7.6  7.3    Total Bilirubin 0.0 - 1.2 mg/dL 0.7  0.3    Alkaline Phos 44 - 121 IU/L 50  43    AST 0 - 40 IU/L 17  15    ALT 0 - 44 IU/L 22  20  25      Imaging: Radiological images reviewed:   Within last 24 hrs: No results found.  Assessment    Anterior abdominal wall fascial defect, incarcerated, estimated less than 3 cm diameter.  No prior history of repair. Patient Active Problem List   Diagnosis Date Noted   Elevated PSA 01/17/2021   Hemorrhoids 12/13/2015   Umbilical hernia 12/13/2015   Male sexual dysfunction 12/13/2015   Hyperlipidemia, mixed 02/01/2009   Fam hx-ischem heart disease 01/31/2009   Tobacco use disorder 07/01/1978    Plan    Open umbilical hernia repair, incarcerated, estimated fascial diameter defect size less than 3 cm. We discussed options of robotic laparoscopic repair with mesh, or even mesh placed during open repair.  At present I do not feel either it is worth the additional risks involved.  And we have agreed to proceed which is primary open repair. I discussed possibility of incarceration, strangulation, enlargement in size over time, and the  need for emergency surgery in the face of these.  Also reviewed the techniques of reduction should incarceration occur, and when unsuccessful to present to the ED.  Also discussed that surgery risks include recurrence which can be up to 30% in the case of complex hernias, use  of prosthetic materials (mesh) and the increased risk of infection and the possible need for re-operation and removal of mesh, possibility of post-op SBO or ileus, and the risks of general anesthetic including heart attack, stroke, sudden death or some reaction to anesthetic medications. The patient, and those present, appear to understand the risks, any and all questions were answered to the patient's satisfaction.  No guarantees were ever expressed or implied.    Face-to-face time spent with the patient and accompanying care providers(if present) was 30 minutes, with more than 50% of the time spent counseling, educating, and coordinating care of the patient.    These notes generated with voice recognition software. I apologize for typographical errors.  Campbell Lerner M.D., FACS 01/30/2023, 9:48 AM

## 2023-01-29 NOTE — H&P (View-Only) (Signed)
Patient ID: Shaun Holland, male   DOB: 1965-12-23, 57 y.o.   MRN: [id number]  Chief Complaint: Umbilical hernia  History of Present Illness Shaun Holland is a 57 y.o. male with an umbilical hernia present for more than 10 years.  Patient reports a gradual increase in size.  Previously able to fully reduce it.  Denies pain.  Desires to lose some weight and exercise more frequently.  Anticipating potential complicating issues should he initiate an exercise regimen while unrepaired.  No prior abdominal surgeries.    Past Medical History Past Medical History:  Diagnosis Date   History of chicken pox    History of mumps       Past Surgical History:  Procedure Laterality Date   Echocardiogram  01/13/2008   [facility]; Normal LVF EF= 66%   Myocardial Perfusion Scan  01/13/2008   Done by Dr. Juliann Pares. Normal perfusion on function    No Known Allergies  Current Outpatient Medications  Medication Sig Dispense Refill   celecoxib (CELEBREX) 200 MG capsule Take 200 mg by mouth daily.     rosuvastatin (CRESTOR) 10 MG tablet Take 1 tablet (10 mg total) by mouth daily. 30 tablet 3   No current facility-administered medications for this visit.    Family History Family History  Problem Relation Age of Onset   Diabetes Father        type 2   Hypertension Father    Hyperlipidemia Father    Stroke Father    Heart attack Other    Heart attack Other        in his 42's      Social History Social History   Tobacco Use   Smoking status: Former    Current packs/day: 0.00    Average packs/day: 0.8 packs/day for 22.0 years (16.5 ttl pk-yrs)    Types: Cigarettes    Start date: 01/30/1992    Quit date: 01/29/2014    Years since quitting: 9.0    Passive exposure: Never   Smokeless tobacco: Never  Vaping Use   Vaping status: Never Used  Substance Use Topics   Alcohol use: Yes    Alcohol/week: 0.0 standard drinks of alcohol    Comment: socially   Drug use: No         Review of Systems  Constitutional: Negative.   HENT: Negative.    Eyes: Negative.   Respiratory: Negative.    Cardiovascular: Negative.   Gastrointestinal: Negative.   Genitourinary: Negative.   Skin: Negative.   Neurological: Negative.   Psychiatric/Behavioral: Negative.       Physical Exam Blood pressure (!) 152/97, pulse 72, temperature 97.8 F (36.6 C), height 6\' 2"  (1.88 m), weight 254 lb 12.8 oz (115.6 kg), SpO2 96%. Last Weight  Most recent update: 01/30/2023  9:16 AM    Weight  115.6 kg (254 lb 12.8 oz)             CONSTITUTIONAL: Well developed, and nourished, appropriately responsive and aware without distress.   EYES: Sclera non-icteric.   EARS, NOSE, MOUTH AND THROAT:  The oropharynx is clear. Oral mucosa is pink and moist.    Hearing is intact to voice.  NECK: Trachea is midline, and there is no jugular venous distension.  LYMPH NODES:  Lymph nodes in the neck are not appreciated. RESPIRATORY:  Lungs are clear, and breath sounds are equal bilaterally.  Normal respiratory effort without pathologic use of accessory muscles. CARDIOVASCULAR: Heart is regular in rate and rhythm.  Well perfused.  GI: The abdomen is notable for a significant skin stretching, slightly purplish hue, palpable mass consistent with omental tissues.  Nontender, unable to reduce though attempts are well-tolerated.  Palpation of the immediate fascial ring somewhat tender, otherwise soft, nontender, and nondistended. There were no palpable masses.  I believe the diameter of the fascial defect is less than 3 cm guesstimating on circumference.  I did not appreciate hepatosplenomegaly. MUSCULOSKELETAL:  Symmetrical muscle tone appreciated in all four extremities.    SKIN: Skin turgor is normal. No pathologic skin lesions appreciated.  NEUROLOGIC:  Motor and sensation appear grossly normal.  Cranial nerves are grossly without defect. PSYCH:  Alert and oriented to person, place and time. Affect is  appropriate for situation.  Data Reviewed I have personally reviewed what is currently available of the patient's imaging, recent labs and medical records.   Labs:     Latest Ref Rng & Units 01/17/2023   10:30 AM 12/04/2020    8:11 AM  CBC  WBC 3.4 - 10.8 x10E3/uL 9.1  7.4   Hemoglobin 13.0 - 17.7 g/dL 74.2  59.5   Hematocrit 37.5 - 51.0 % 48.0  47.3   Platelets 150 - 450 x10E3/uL 296  287       Latest Ref Rng & Units 01/17/2023   10:30 AM 12/04/2020    8:11 AM 05/22/2011   12:00 AM  CMP  Glucose 70 - 99 mg/dL 99  638    BUN 6 - 24 mg/dL 18  22    Creatinine 7.56 - 1.27 mg/dL 4.33  2.95    Sodium 188 - 144 mmol/L 142  143    Potassium 3.5 - 5.2 mmol/L 5.1  4.8    Chloride 96 - 106 mmol/L 104  107    CO2 20 - 29 mmol/L 23  21    Calcium 8.7 - 10.2 mg/dL 41.6  9.5    Total Protein 6.0 - 8.5 g/dL 7.6  7.3    Total Bilirubin 0.0 - 1.2 mg/dL 0.7  0.3    Alkaline Phos 44 - 121 IU/L 50  43    AST 0 - 40 IU/L 17  15    ALT 0 - 44 IU/L 22  20  25      Imaging: Radiological images reviewed:   Within last 24 hrs: No results found.  Assessment    Anterior abdominal wall fascial defect, incarcerated, estimated less than 3 cm diameter.  No prior history of repair. Patient Active Problem List   Diagnosis Date Noted   Elevated PSA 01/17/2021   Hemorrhoids 12/13/2015   Umbilical hernia 12/13/2015   Male sexual dysfunction 12/13/2015   Hyperlipidemia, mixed 02/01/2009   Fam hx-ischem heart disease 01/31/2009   Tobacco use disorder 07/01/1978    Plan    Open umbilical hernia repair, incarcerated, estimated fascial diameter defect size less than 3 cm. We discussed options of robotic laparoscopic repair with mesh, or even mesh placed during open repair.  At present I do not feel either it is worth the additional risks involved.  And we have agreed to proceed which is primary open repair. I discussed possibility of incarceration, strangulation, enlargement in size over time, and the  need for emergency surgery in the face of these.  Also reviewed the techniques of reduction should incarceration occur, and when unsuccessful to present to the ED.  Also discussed that surgery risks include recurrence which can be up to 30% in the case of complex hernias, use  of prosthetic materials (mesh) and the increased risk of infection and the possible need for re-operation and removal of mesh, possibility of post-op SBO or ileus, and the risks of general anesthetic including heart attack, stroke, sudden death or some reaction to anesthetic medications. The patient, and those present, appear to understand the risks, any and all questions were answered to the patient's satisfaction.  No guarantees were ever expressed or implied.    Face-to-face time spent with the patient and accompanying care providers(if present) was 30 minutes, with more than 50% of the time spent counseling, educating, and coordinating care of the patient.    These notes generated with voice recognition software. I apologize for typographical errors.  Campbell Lerner M.D., FACS 01/30/2023, 9:48 AM

## 2023-01-30 ENCOUNTER — Ambulatory Visit: Payer: Self-pay | Admitting: Surgery

## 2023-01-30 ENCOUNTER — Ambulatory Visit: Payer: BC Managed Care – PPO | Admitting: Surgery

## 2023-01-30 ENCOUNTER — Encounter: Payer: Self-pay | Admitting: Surgery

## 2023-01-30 VITALS — BP 152/97 | HR 72 | Temp 97.8°F | Ht 74.0 in | Wt 254.8 lb

## 2023-01-30 DIAGNOSIS — K429 Umbilical hernia without obstruction or gangrene: Secondary | ICD-10-CM

## 2023-01-30 DIAGNOSIS — K420 Umbilical hernia with obstruction, without gangrene: Secondary | ICD-10-CM | POA: Diagnosis not present

## 2023-01-30 NOTE — Patient Instructions (Signed)
Our surgery scheduler Britta Mccreedy will call you within 24-48 hours to get you scheduled. If you have not heard from her after 48 hours, please call our office. Have the blue sheet available when she calls to write down important information.   Umbilical Hernia, Adult  A hernia is a lump of tissue that pushes through an opening in the muscles. An umbilical hernia happens in the belly, near the belly button. The hernia may contain tissues from the small or large intestine. It may also have fatty tissue that covers the intestines. Umbilical hernias in adults may get worse over time. They need to be treated with surgery. There are several types of umbilical hernias. They include: Indirect hernia. This occurs just above or below the belly button. It's the most common type of umbilical hernia in adults. Direct hernia. This type occurs in an opening that's formed by the belly button. Reducible hernia. This hernia comes and goes. You may see it only when you strain, cough, or lift something heavy. This type of hernia can be pushed back into the belly (reduced). Incarcerated hernia. This traps the hernia in the wall of the belly. This type of hernia can't be pushed back into the belly. It can cause a strangulated hernia. Strangulated hernia. This hernia cuts off blood flow to the tissues inside the hernia. The tissues can die if this happens. This type of hernia must be treated right away. What are the causes? An umbilical hernia happens when tissue inside the belly pushes through an opening in the muscles of the belly. What increases the risk? You're more likely to get this hernia if: You strain while lifting or pushing heavy objects. You've had several pregnancies. You have a condition that puts pressure on your belly, and you've had it for a long time. These include: Obesity. A buildup of fluid inside your belly. Vomiting or coughing all the time. Trouble pooping (constipation). You've had surgery that  weakened the muscles in the belly. What are the signs or symptoms? The main symptom of this condition is a bulge at the belly button or near it. The bulge does not cause pain. Other symptoms depend on the type of hernia you have. A reducible hernia may be seen only when you strain, cough, or lift something heavy. Other symptoms may include: Dull pain. A feeling of pressure. An incarcerated hernia may cause very bad pain. Also, you may: Vomit or feel like you may vomit. Not be able to pass gas. A strangulated hernia may cause: Pain that gets worse and worse. Vomiting, or feeling like you may vomit. Pain when you press on the hernia. Change of color on the skin over the hernia. The skin may become red or purple. Trouble pooping. Blood in the poop. How is this diagnosed? This condition may be diagnosed based on: Your symptoms and medical history. A physical exam. You may be asked to cough or strain while standing. These actions will put pressure inside your belly. The pressure can force the hernia through the opening in your muscles. Your health care provider may try to push the hernia back into your belly (reduce). How is this treated? Surgery is the only treatment for an umbilical hernia. Surgery for a strangulated hernia must be done right away. If you have a small hernia that's not incarcerated, you may need to lose weight before the surgery is done. Follow these instructions at home: Managing constipation You may need to take these actions to prevent trouble pooping. This  will help to prevent straining. Drink enough fluid to keep your pee (urine) pale yellow. Take over-the-counter or prescription medicines. Eat foods that are high in fiber, such as beans, whole grains, and fresh fruits and vegetables. Limit foods that are high in fat and sugars, such as fried or sweet foods. General instructions Do not try to push the hernia back in. Lose weight, if told by your provider. Watch  your hernia for any changes in color or size. Tell your provider if any changes occur. You may need to avoid activities that put pressure on your hernia. You may have to avoid lifting. Ask your provider how much you can safely lift. Take over-the-counter and prescription medicines only as told by your provider. Contact a health care provider if: Your hernia gets larger or feels hard. Your hernia becomes painful. You get a fever or chills. Get help right away if: You get very bad pain near the area of the hernia, and the pain comes on suddenly. You have pain and you vomit or feel like you may vomit. The skin over your hernia changes color. These symptoms may be an emergency. Get help right away. Call 911. Do not wait to see if the symptoms go away. Do not drive yourself to the hospital. This information is not intended to replace advice given to you by your health care provider. Make sure you discuss any questions you have with your health care provider. Document Revised: 10/08/2022 Document Reviewed: 10/08/2022 Elsevier Patient Education  2024 [employer].

## 2023-01-31 ENCOUNTER — Inpatient Hospital Stay: Admission: RE | Admit: 2023-01-31 | Payer: BC Managed Care – PPO | Source: Ambulatory Visit

## 2023-01-31 ENCOUNTER — Telehealth: Payer: Self-pay | Admitting: Surgery

## 2023-01-31 NOTE — Telephone Encounter (Signed)
Patient called back and is aware of his surgery information and was advise if he has any questions or concerns to call the office.

## 2023-01-31 NOTE — Telephone Encounter (Signed)
Left message for patient to call please inform him of the following regarding scheduled surgery with Dr. Claudine Mouton.   Pre-Admission date/time, and Surgery date at [facility].  Surgery Date: 02/10/23 Preadmission Testing Date: 02/03/23 (phone 1p-4p)  Also patient will need to call at [phone], between 1-3:00pm the day before surgery, to find out what time to arrive for surgery.

## 2023-02-03 ENCOUNTER — Other Ambulatory Visit: Payer: Self-pay

## 2023-02-03 ENCOUNTER — Encounter
Admission: RE | Admit: 2023-02-03 | Discharge: 2023-02-03 | Disposition: A | Discharge status: Home / Self Care | Payer: BC Managed Care – PPO | Source: Ambulatory Visit | Attending: Surgery | Admitting: Surgery

## 2023-02-03 HISTORY — DX: Hyperlipidemia, unspecified: E78.5

## 2023-02-03 NOTE — Patient Instructions (Signed)
Your procedure is scheduled on: Monday 02/10/23 To find out your arrival time, please call [phone] between 1PM - 3PM on:  Friday  02/07/23 Report to the Registration Desk on the 1st floor of the [facility]. Free Valet parking is available.  If your arrival time is 6:00 am, do not arrive before that time as the [facility] entrance doors do not open until 6:00 am.  REMEMBER: Instructions that are not followed completely may result in serious medical risk, up to and including death; or upon the discretion of your surgeon and anesthesiologist your surgery may need to be rescheduled.  Do not eat food or drink any liquids after midnight the night before surgery.  No gum chewing or hard candies.  One week prior to surgery: Stop Anti-inflammatories (NSAIDS) such as Advil, Aleve, Ibuprofen, Motrin, Naproxen, Naprosyn and Aspirin based products such as Excedrin, Goody's Powder, BC Powder. You may however, continue to take Tylenol if needed for pain up until the day of surgery.  Stop ANY OVER THE COUNTER supplements or vitamins until after surgery.  Continue taking all prescribed medications.  TAKE ONLY THESE MEDICATIONS THE MORNING OF SURGERY WITH A SIP OF WATER:  rosuvastatin (CRESTOR) 10 MG tablet   No Alcohol for 24 hours before or after surgery.  No Smoking including e-cigarettes for 24 hours before surgery.  No chewable tobacco products for at least 6 hours before surgery.  No nicotine patches on the day of surgery.  Do not use any "recreational" drugs for at least a week (preferably 2 weeks) before your surgery.  Please be advised that the combination of cocaine and anesthesia may have negative outcomes, up to and including death. If you test positive for cocaine, your surgery will be cancelled.  On the morning of surgery brush your teeth with toothpaste and water, you may rinse your mouth with mouthwash if you wish. Do not swallow any toothpaste or mouthwash.  Use CHG Soap  or wipes as directed on instruction sheet.  Do not wear lotions, powders, or perfumes.   Do not shave body hair from the neck down 48 hours before surgery.  Wear comfortable clothing (specific to your surgery type) to the hospital.  Do not wear jewelry, make-up, hairpins, clips or nail polish.  Contact lenses, hearing aids and dentures may not be worn into surgery.  Do not bring valuables to the hospital. [facility] is not responsible for any missing/lost belongings or valuables.   Notify your doctor if there is any change in your medical condition (cold, fever, infection).  If you are being discharged the day of surgery, you will not be allowed to drive home. You will need a responsible individual to drive you home and stay with you for 24 hours after surgery.   If you are taking public transportation, you will need to have a responsible individual with you.  If you are being admitted to the hospital overnight, leave your suitcase in the car. After surgery it may be brought to your room.  In case of increased patient census, it may be necessary for you, the patient, to continue your postoperative care in the Same Day Surgery department.  After surgery, you can help prevent lung complications by doing breathing exercises.  Take deep breaths and cough every 1-2 hours. Your doctor may order a device called an Incentive Spirometer to help you take deep breaths. When coughing or sneezing, hold a pillow firmly against your incision with both hands. This is called "splinting."  Doing this helps protect your incision. It also decreases belly discomfort.  Surgery Visitation Policy:  Patients undergoing a surgery or procedure may have two family members or support persons with them as long as the person is not COVID-19 positive or experiencing its symptoms.   Inpatient Visitation:    Visiting hours are 7 a.m. to 8 p.m. Up to four visitors are allowed at one time in a patient room. The  visitors may rotate out with other people during the day. One designated support person (adult) may remain overnight.  Please call the Pre-admissions Testing Dept. at [phone] if you have any questions about these instructions.     Preparing for Surgery with CHLORHEXIDINE GLUCONATE (CHG) Soap  Chlorhexidine Gluconate (CHG) Soap  o An antiseptic cleaner that kills germs and bonds with the skin to continue killing germs even after washing  o Used for showering the night before surgery and morning of surgery  Before surgery, you can play an important role by reducing the number of germs on your skin.  CHG (Chlorhexidine gluconate) soap is an antiseptic cleanser which kills germs and bonds with the skin to continue killing germs even after washing.  Please do not use if you have an allergy to CHG or antibacterial soaps. If your skin becomes reddened/irritated stop using the CHG.  1. Shower the NIGHT BEFORE SURGERY and the MORNING OF SURGERY with CHG soap.  2. If you choose to wash your hair, wash your hair first as usual with your normal shampoo.  3. After shampooing, rinse your hair and body thoroughly to remove the shampoo.  4. Use CHG as you would any other liquid soap. You can apply CHG directly to the skin and wash gently with a scrungie or a clean washcloth.  5. Apply the CHG soap to your body only from the neck down. Do not use on open wounds or open sores. Avoid contact with your eyes, ears, mouth, and genitals (private parts). Wash face and genitals (private parts) with your normal soap.  6. Wash thoroughly, paying special attention to the area where your surgery will be performed.  7. Thoroughly rinse your body with warm water.  8. Do not shower/wash with your normal soap after using and rinsing off the CHG soap.  9. Pat yourself dry with a clean towel.  10. Wear clean pajamas to bed the night before surgery.  12. Place clean sheets on your bed the night of your  first shower and do not sleep with pets.  13. Shower again with the CHG soap on the day of surgery prior to arriving at the hospital.  14. Do not apply any deodorants/lotions/powders.  15. Please wear clean clothes to the hospital.

## 2023-02-10 ENCOUNTER — Other Ambulatory Visit: Payer: Self-pay

## 2023-02-10 ENCOUNTER — Ambulatory Visit: Payer: BC Managed Care – PPO | Admitting: Anesthesiology

## 2023-02-10 ENCOUNTER — Encounter
Admission: RE | Disposition: A | Discharge status: Home / Self Care | Payer: Self-pay | Source: Home / Self Care | Attending: Surgery

## 2023-02-10 ENCOUNTER — Encounter: Payer: Self-pay | Admitting: Surgery

## 2023-02-10 ENCOUNTER — Ambulatory Visit
Admission: RE | Admit: 2023-02-10 | Discharge: 2023-02-10 | Disposition: A | Discharge status: Home / Self Care | Payer: BC Managed Care – PPO | Attending: Surgery | Admitting: Surgery

## 2023-02-10 DIAGNOSIS — K420 Umbilical hernia with obstruction, without gangrene: Secondary | ICD-10-CM

## 2023-02-10 DIAGNOSIS — Z87891 Personal history of nicotine dependence: Secondary | ICD-10-CM | POA: Diagnosis not present

## 2023-02-10 DIAGNOSIS — K429 Umbilical hernia without obstruction or gangrene: Secondary | ICD-10-CM

## 2023-02-10 HISTORY — PX: UMBILICAL HERNIA REPAIR: SHX196

## 2023-02-10 SURGERY — REPAIR, HERNIA, UMBILICAL, ADULT
Anesthesia: General

## 2023-02-10 MED ORDER — ACETAMINOPHEN 500 MG PO TABS
1000.0000 mg | ORAL_TABLET | ORAL | Status: AC
  Administered 2023-02-10: 1000 mg via ORAL

## 2023-02-10 MED ORDER — DROPERIDOL 2.5 MG/ML IJ SOLN
INTRAMUSCULAR | Status: AC
  Filled 2023-02-10: qty 2

## 2023-02-10 MED ORDER — FENTANYL CITRATE (PF) 100 MCG/2ML IJ SOLN
INTRAMUSCULAR | Status: AC
  Filled 2023-02-10: qty 2

## 2023-02-10 MED ORDER — CELECOXIB 200 MG PO CAPS
ORAL_CAPSULE | ORAL | Status: AC
  Filled 2023-02-10: qty 1

## 2023-02-10 MED ORDER — FAMOTIDINE 20 MG PO TABS
20.0000 mg | ORAL_TABLET | Freq: Once | ORAL | Status: AC
  Administered 2023-02-10: 20 mg via ORAL

## 2023-02-10 MED ORDER — PROPOFOL 10 MG/ML IV BOLUS
INTRAVENOUS | Status: DC | PRN
Start: 2023-02-10 — End: 2023-02-10
  Administered 2023-02-10: 250 mg via INTRAVENOUS
  Administered 2023-02-10: 50 mg via INTRAVENOUS

## 2023-02-10 MED ORDER — 0.9 % SODIUM CHLORIDE (POUR BTL) OPTIME
TOPICAL | Status: DC | PRN
  Administered 2023-02-10: 500 mL

## 2023-02-10 MED ORDER — OXYCODONE HCL 5 MG/5ML PO SOLN: 5.0000 mg | Freq: Once | ORAL | Status: AC | PRN

## 2023-02-10 MED ORDER — ROCURONIUM BROMIDE 100 MG/10ML IV SOLN
INTRAVENOUS | Status: DC | PRN
  Administered 2023-02-10: 60 mg via INTRAVENOUS
  Administered 2023-02-10: 10 mg via INTRAVENOUS

## 2023-02-10 MED ORDER — BUPIVACAINE-EPINEPHRINE (PF) 0.25% -1:200000 IJ SOLN
INTRAMUSCULAR | Status: AC
  Filled 2023-02-10: qty 30

## 2023-02-10 MED ORDER — CEFAZOLIN SODIUM-DEXTROSE 2-4 GM/100ML-% IV SOLN
2.0000 g | INTRAVENOUS | Status: AC
  Administered 2023-02-10: 2 g via INTRAVENOUS

## 2023-02-10 MED ORDER — PROPOFOL 10 MG/ML IV BOLUS
INTRAVENOUS | Status: AC
  Filled 2023-02-10: qty 40

## 2023-02-10 MED ORDER — GABAPENTIN 300 MG PO CAPS
ORAL_CAPSULE | ORAL | Status: AC
  Filled 2023-02-10: qty 1

## 2023-02-10 MED ORDER — MIDAZOLAM HCL 2 MG/2ML IJ SOLN
INTRAMUSCULAR | Status: DC | PRN
  Administered 2023-02-10: 2 mg via INTRAVENOUS

## 2023-02-10 MED ORDER — BUPIVACAINE LIPOSOME 1.3 % IJ SUSP: 20.0000 mL | Freq: Once | INTRAMUSCULAR | Status: DC

## 2023-02-10 MED ORDER — DEXAMETHASONE SODIUM PHOSPHATE 10 MG/ML IJ SOLN
INTRAMUSCULAR | Status: AC
  Filled 2023-02-10: qty 1

## 2023-02-10 MED ORDER — ROCURONIUM BROMIDE 10 MG/ML (PF) SYRINGE
PREFILLED_SYRINGE | INTRAVENOUS | Status: AC
  Filled 2023-02-10: qty 10

## 2023-02-10 MED ORDER — FAMOTIDINE 20 MG PO TABS
ORAL_TABLET | ORAL | Status: AC
  Filled 2023-02-10: qty 1

## 2023-02-10 MED ORDER — OXYCODONE HCL 5 MG PO TABS
5.0000 mg | ORAL_TABLET | Freq: Once | ORAL | Status: AC | PRN
  Administered 2023-02-10: 5 mg via ORAL

## 2023-02-10 MED ORDER — SUGAMMADEX SODIUM 500 MG/5ML IV SOLN
INTRAVENOUS | Status: DC | PRN
  Administered 2023-02-10: 200 mg via INTRAVENOUS

## 2023-02-10 MED ORDER — LACTATED RINGERS IV SOLN: INTRAVENOUS | Status: DC

## 2023-02-10 MED ORDER — ONDANSETRON HCL 4 MG/2ML IJ SOLN
INTRAMUSCULAR | Status: AC
  Filled 2023-02-10: qty 2

## 2023-02-10 MED ORDER — PROMETHAZINE HCL 25 MG/ML IJ SOLN: 6.2500 mg | INTRAMUSCULAR | Status: DC | PRN

## 2023-02-10 MED ORDER — FENTANYL CITRATE (PF) 100 MCG/2ML IJ SOLN
INTRAMUSCULAR | Status: DC | PRN
  Administered 2023-02-10 (×4): 50 ug via INTRAVENOUS

## 2023-02-10 MED ORDER — HYDROCODONE-ACETAMINOPHEN 5-325 MG PO TABS: 1.0000 | ORAL_TABLET | Freq: Four times a day (QID) | ORAL | 0 refills | Status: DC | PRN

## 2023-02-10 MED ORDER — DROPERIDOL 2.5 MG/ML IJ SOLN
0.6250 mg | Freq: Once | INTRAMUSCULAR | Status: AC | PRN
  Administered 2023-02-10: 0.625 mg via INTRAVENOUS

## 2023-02-10 MED ORDER — CHLORHEXIDINE GLUCONATE 0.12 % MT SOLN
15.0000 mL | Freq: Once | OROMUCOSAL | Status: AC
  Administered 2023-02-10: 15 mL via OROMUCOSAL

## 2023-02-10 MED ORDER — ORAL CARE MOUTH RINSE: 15.0000 mL | Freq: Once | OROMUCOSAL | Status: AC

## 2023-02-10 MED ORDER — ACETAMINOPHEN 10 MG/ML IV SOLN: 1000.0000 mg | Freq: Once | INTRAVENOUS | Status: DC | PRN

## 2023-02-10 MED ORDER — CHLORHEXIDINE GLUCONATE CLOTH 2 % EX PADS: 6.0000 | MEDICATED_PAD | Freq: Once | CUTANEOUS | Status: DC

## 2023-02-10 MED ORDER — ACETAMINOPHEN 500 MG PO TABS
ORAL_TABLET | ORAL | Status: AC
  Filled 2023-02-10: qty 2

## 2023-02-10 MED ORDER — CELECOXIB 200 MG PO CAPS
200.0000 mg | ORAL_CAPSULE | ORAL | Status: AC
  Administered 2023-02-10: 200 mg via ORAL

## 2023-02-10 MED ORDER — DEXAMETHASONE SODIUM PHOSPHATE 10 MG/ML IJ SOLN
INTRAMUSCULAR | Status: DC | PRN
  Administered 2023-02-10: 10 mg via INTRAVENOUS

## 2023-02-10 MED ORDER — LIDOCAINE HCL (PF) 2 % IJ SOLN
INTRAMUSCULAR | Status: AC
  Filled 2023-02-10: qty 5

## 2023-02-10 MED ORDER — FENTANYL CITRATE (PF) 100 MCG/2ML IJ SOLN
25.0000 ug | INTRAMUSCULAR | Status: DC | PRN
  Administered 2023-02-10 (×2): 50 ug via INTRAVENOUS

## 2023-02-10 MED ORDER — BUPIVACAINE-EPINEPHRINE 0.25% -1:200000 IJ SOLN
INTRAMUSCULAR | Status: DC | PRN
  Administered 2023-02-10: 30 mL

## 2023-02-10 MED ORDER — LIDOCAINE HCL (CARDIAC) PF 100 MG/5ML IV SOSY
PREFILLED_SYRINGE | INTRAVENOUS | Status: DC | PRN
  Administered 2023-02-10: 80 mg via INTRAVENOUS

## 2023-02-10 MED ORDER — MIDAZOLAM HCL 2 MG/2ML IJ SOLN
INTRAMUSCULAR | Status: AC
  Filled 2023-02-10: qty 2

## 2023-02-10 MED ORDER — CEFAZOLIN SODIUM-DEXTROSE 2-4 GM/100ML-% IV SOLN
INTRAVENOUS | Status: AC
  Filled 2023-02-10: qty 100

## 2023-02-10 MED ORDER — GABAPENTIN 300 MG PO CAPS
300.0000 mg | ORAL_CAPSULE | ORAL | Status: AC
  Administered 2023-02-10: 300 mg via ORAL

## 2023-02-10 MED ORDER — IBUPROFEN 800 MG PO TABS: 800.0000 mg | ORAL_TABLET | Freq: Three times a day (TID) | ORAL | 0 refills | Status: DC | PRN

## 2023-02-10 MED ORDER — ONDANSETRON HCL 4 MG/2ML IJ SOLN
INTRAMUSCULAR | Status: DC | PRN
  Administered 2023-02-10: 4 mg via INTRAVENOUS

## 2023-02-10 MED ORDER — OXYCODONE HCL 5 MG PO TABS
ORAL_TABLET | ORAL | Status: AC
  Filled 2023-02-10: qty 1

## 2023-02-10 MED ORDER — CHLORHEXIDINE GLUCONATE 0.12 % MT SOLN
OROMUCOSAL | Status: AC
  Filled 2023-02-10: qty 15

## 2023-02-10 SURGICAL SUPPLY — 34 items
ADH SKN CLS APL DERMABOND .7 (GAUZE/BANDAGES/DRESSINGS) ×1
APL PRP STRL LF DISP 70% ISPRP (MISCELLANEOUS) ×1
BINDER ABDOMINAL 12 ML 46-62 (SOFTGOODS) IMPLANT
BLADE CLIPPER SURG (BLADE) IMPLANT
BLADE SURG 15 STRL LF DISP TIS (BLADE) ×1 IMPLANT
BLADE SURG 15 STRL SS (BLADE) ×1
CHLORAPREP W/TINT 26 (MISCELLANEOUS) ×1 IMPLANT
DERMABOND ADVANCED .7 DNX12 (GAUZE/BANDAGES/DRESSINGS) ×1 IMPLANT
DRAPE LAPAROTOMY 77X122 PED (DRAPES) ×1 IMPLANT
ELECT CAUTERY BLADE 6.4 (BLADE) ×1 IMPLANT
ELECT REM PT RETURN 9FT ADLT (ELECTROSURGICAL) ×1
ELECTRODE REM PT RTRN 9FT ADLT (ELECTROSURGICAL) ×1 IMPLANT
GAUZE 4X4 16PLY ~~LOC~~+RFID DBL (SPONGE) ×1 IMPLANT
GLOVE ORTHO TXT STRL SZ7.5 (GLOVE) ×1 IMPLANT
GOWN STRL REUS W/ TWL LRG LVL3 (GOWN DISPOSABLE) ×1 IMPLANT
GOWN STRL REUS W/ TWL XL LVL3 (GOWN DISPOSABLE) ×1 IMPLANT
GOWN STRL REUS W/TWL LRG LVL3 (GOWN DISPOSABLE) ×1
GOWN STRL REUS W/TWL XL LVL3 (GOWN DISPOSABLE) ×1
KIT TURNOVER KIT A (KITS) ×1 IMPLANT
MANIFOLD NEPTUNE II (INSTRUMENTS) ×1 IMPLANT
NDL HYPO 22X1.5 SAFETY MO (MISCELLANEOUS) ×1 IMPLANT
NEEDLE HYPO 22X1.5 SAFETY MO (MISCELLANEOUS) ×1 IMPLANT
NS IRRIG 500ML POUR BTL (IV SOLUTION) ×1 IMPLANT
PACK BASIN MINOR ARMC (MISCELLANEOUS) ×1 IMPLANT
SPIKE FLUID TRANSFER (MISCELLANEOUS) ×1 IMPLANT
SUT ETHIBOND 0 MO6 C/R (SUTURE) ×1 IMPLANT
SUT MNCRL 4-0 (SUTURE) ×1
SUT MNCRL 4-0 27XMFL (SUTURE) ×1
SUT VIC AB 3-0 SH 27 (SUTURE) ×2
SUT VIC AB 3-0 SH 27X BRD (SUTURE) ×1 IMPLANT
SUTURE MNCRL 4-0 27XMF (SUTURE) ×1 IMPLANT
SYR 10ML LL (SYRINGE) ×1 IMPLANT
TRAP FLUID SMOKE EVACUATOR (MISCELLANEOUS) ×1 IMPLANT
WATER STERILE IRR 500ML POUR (IV SOLUTION) ×1 IMPLANT

## 2023-02-10 NOTE — Interval H&P Note (Signed)
History and Physical Interval Note:  02/10/2023 8:48 AM  Shaun Holland  has presented today for surgery, with the diagnosis of umbilical hernia incarcerated.  The various methods of treatment have been discussed with the patient and family. After consideration of risks, benefits and other options for treatment, the patient has consented to  Procedure(s): HERNIA REPAIR UMBILICAL ADULT, incarcerated hernia, open (N/A) as a surgical intervention.  The patient's history has been reviewed, patient examined, no change in status, stable for surgery.  I have reviewed the patient's chart and labs.  Questions were answered to the patient's satisfaction.     Campbell Lerner

## 2023-02-10 NOTE — Anesthesia Preprocedure Evaluation (Addendum)
Anesthesia Evaluation  Patient identified by MRN, date of birth, ID band Patient awake    Reviewed: Allergy & Precautions, H&P , NPO status , Patient's Chart, lab work & pertinent test results  Airway Mallampati: IV  TM Distance: >3 FB Neck ROM: full    Dental no notable dental hx.    Pulmonary former smoker   Pulmonary exam normal        Cardiovascular negative cardio ROS Normal cardiovascular exam     Neuro/Psych negative neurological ROS  negative psych ROS   GI/Hepatic Neg liver ROS,,,  Endo/Other  negative endocrine ROS    Renal/GU      Musculoskeletal   Abdominal  (+) + obese  Peds  Hematology negative hematology ROS (+)   Anesthesia Other Findings umbilical hernia incarcerated  Past Medical History: No date: History of chicken pox No date: History of mumps No date: HLD (hyperlipidemia)  Past Surgical History: No date: COLONOSCOPY 01/13/2008: Echocardiogram     Comment:  [facility]; Normal LVF EF= 66% 01/13/2008: Myocardial Perfusion Scan     Comment:  Done by Dr. Juliann Pares. Normal perfusion on function  BMI    Body Mass Index: 32.35 kg/m      Reproductive/Obstetrics negative OB ROS                              Anesthesia Physical Anesthesia Plan  ASA: 2  Anesthesia Plan: General ETT   Post-op Pain Management: Tylenol PO (pre-op)*, Celebrex PO (pre-op)* and Gabapentin PO (pre-op)*   Induction: Intravenous  PONV Risk Score and Plan: 2 and Ondansetron, Dexamethasone and Midazolam  Airway Management Planned: Oral ETT  Additional Equipment:   Intra-op Plan:   Post-operative Plan: Extubation in OR  Informed Consent: I have reviewed the patients History and Physical, chart, labs and discussed the procedure including the risks, benefits and alternatives for the proposed anesthesia with the patient or authorized representative who has indicated his/her understanding  and acceptance.     Dental Advisory Given  Plan Discussed with: CRNA and Surgeon  Anesthesia Plan Comments:          Anesthesia Quick Evaluation

## 2023-02-10 NOTE — Anesthesia Postprocedure Evaluation (Signed)
Anesthesia Post Note  Patient: Shaun Holland  Procedure(s) Performed: HERNIA REPAIR UMBILICAL ADULT, incarcerated hernia, open  Patient location during evaluation: PACU Anesthesia Type: General Level of consciousness: awake and alert Pain management: pain level controlled Vital Signs Assessment: post-procedure vital signs reviewed and stable Respiratory status: spontaneous breathing, nonlabored ventilation and respiratory function stable Cardiovascular status: blood pressure returned to baseline and stable Postop Assessment: no apparent nausea or vomiting Anesthetic complications: no   No notable events documented.   Last Vitals:  Vitals:   02/10/23 1130 02/10/23 1156  BP: 130/81 (!) 145/85  Pulse: 60 66  Resp: 16 16  Temp: (!) 36.2 C 36.7 C  SpO2: 96% 98%    Last Pain:  Vitals:   02/10/23 1156  TempSrc: Temporal  PainSc: 5                  Foye Deer

## 2023-02-10 NOTE — Op Note (Signed)
Umbilical Hernia Repair, Initial, non-reducible.   Pre-operative Diagnosis: Umbilical hernia  Post-operative Diagnosis: same  Surgeon: Campbell Lerner, MD FACS  Anesthesia: General  Findings: 2 cm fascial defect diameter, incarcerated.   Estimated Blood Loss: 5 mL         Specimens: redundant skin discarded.         Complications: none              Procedure Details  The patient was seen again in the Holding Room. The benefits, complications, treatment options, and expected outcomes were discussed with the patient. The risks of bleeding, infection, recurrence of symptoms, failure to resolve symptoms, bowel injury, mesh placement, mesh infection, any of which could require further surgery were reviewed with the patient. The likelihood of improving the patient's symptoms with return to their baseline status is good.  The patient and/or family concurred with the proposed plan, giving informed consent.  The patient was taken to Operating Room, identified, and the procedure verified.  A Time Out was held and the above information confirmed.  Prior to the induction of general anesthesia, antibiotic prophylaxis was administered. VTE prophylaxis was in place. General endotracheal anesthesia was then administered and tolerated well. After the induction, the abdomen was prepped with Chloraprep and draped in the sterile fashion. The patient was positioned in the supine position.  Incision was created with a scalpel over the hernia defect, excising the redundant skin. Electrocautery was used to dissect through subcutaneous tissue, the hernia sac was dissected from the fascial ring, and continued into the pre-peritoneal space. The hernia was measured. 2 cm. I closed the hernia defect with interrupted 0 Ethibond sutures in a vest-over-pants fashion.  Marcaine quarter percent with epinephrine and lidocaine 1% was used to inject the site.  Incision was closed in a 2 layer fashion with 3-0 Vicryl to pexy  the umbilical skin and 4-0 Monocryl. Dermabond was used to coat the skin. Patient tolerated procedure well and there were no immediate complications. Needle and laparotomy counts were correct.  Abdominal binder placed.   Campbell Lerner, M.D., [facility]  02/10/2023 ; 10:45 AM

## 2023-02-10 NOTE — Discharge Instructions (Signed)
AMBULATORY SURGERY  DISCHARGE INSTRUCTIONS   The drugs that you were given will stay in your system until tomorrow so for the next 24 hours you should not:  Drive an automobile Make any legal decisions Drink any alcoholic beverage   You may resume regular meals tomorrow.  Today it is better to start with liquids and gradually work up to solid foods.  You may eat anything you prefer, but it is better to start with liquids, then soup and crackers, and gradually work up to solid foods.   Please notify your doctor immediately if you have any unusual bleeding, trouble breathing, redness and pain at the surgery site, drainage, fever, or pain not relieved by medication.    Additional Instructions: 

## 2023-02-10 NOTE — Transfer of Care (Signed)
Immediate Anesthesia Transfer of Care Note  Patient: Shaun Holland [facility]  Procedure(s) Performed: HERNIA REPAIR UMBILICAL ADULT, incarcerated hernia, open  Patient Location: PACU  Anesthesia Type:General  Level of Consciousness: awake, oriented, and patient cooperative  Airway & Oxygen Therapy: Patient Spontanous Breathing and Patient connected to nasal cannula oxygen  Post-op Assessment: Report given to RN and Post -op Vital signs reviewed and stable  Post vital signs: Reviewed and stable  Last Vitals:  Vitals Value Taken Time  BP 144/88 02/10/23 1036  Temp    Pulse 58 02/10/23 1038  Resp 11 02/10/23 1038  SpO2 100 % 02/10/23 1038  Vitals shown include unfiled device data.  Last Pain:  Vitals:   02/10/23 0813  TempSrc: Tympanic  PainSc: 0-No pain         Complications: No notable events documented.

## 2023-02-10 NOTE — Anesthesia Procedure Notes (Signed)
Procedure Name: Intubation Date/Time: 02/10/2023 9:18 AM  Performed by: Jeannene Patella, CRNAPre-anesthesia Checklist: Patient identified, Emergency Drugs available, Suction available, Patient being monitored and Timeout performed Patient Re-evaluated:Patient Re-evaluated prior to induction Oxygen Delivery Method: Circle system utilized Preoxygenation: Pre-oxygenation with 100% oxygen Induction Type: IV induction Ventilation: Mask ventilation without difficulty and Oral airway inserted - appropriate to patient size Laryngoscope Size: McGraph and 4 Grade View: Grade II Tube type: Oral Tube size: 7.5 mm Number of attempts: 1 Airway Equipment and Method: Stylet and Video-laryngoscopy Placement Confirmation: ETT inserted through vocal cords under direct vision, positive ETCO2 and breath sounds checked- equal and bilateral Secured at: 23 (lip) cm Tube secured with: Tape Dental Injury: Teeth and Oropharynx as per pre-operative assessment

## 2023-02-11 ENCOUNTER — Encounter: Payer: Self-pay | Admitting: Surgery

## 2023-02-27 ENCOUNTER — Ambulatory Visit (INDEPENDENT_AMBULATORY_CARE_PROVIDER_SITE_OTHER): Payer: BC Managed Care – PPO | Admitting: Physician Assistant

## 2023-02-27 ENCOUNTER — Encounter: Payer: Self-pay | Admitting: Physician Assistant

## 2023-02-27 VITALS — BP 134/78 | HR 80 | Temp 98.0°F | Ht 74.0 in | Wt 255.0 lb

## 2023-02-27 DIAGNOSIS — K420 Umbilical hernia with obstruction, without gangrene: Secondary | ICD-10-CM

## 2023-02-27 DIAGNOSIS — Z09 Encounter for follow-up examination after completed treatment for conditions other than malignant neoplasm: Secondary | ICD-10-CM

## 2023-02-27 DIAGNOSIS — K429 Umbilical hernia without obstruction or gangrene: Secondary | ICD-10-CM

## 2023-02-27 NOTE — Progress Notes (Signed)
[facility] POST-OP OFFICE VISIT  02/27/2023  HPI: Shaun Holland is a 57 y.o. male 17 days s/p umbilical hernia repair  He is doing well Soreness for about 2-3 days; quickly improved No fever, chills, nausea, emesis, or bowel changes Incision healing well Ambulating without issue  Vital signs: BP 134/78   Pulse 80   Temp 98 F (36.7 C) (Oral)   Ht 6\' 2"  (1.88 m)   Wt 255 lb (115.7 kg)   SpO2 96%   BMI 32.74 kg/m    Physical Exam: Constitutional: Well appearing male, NAD Abdomen: Soft, non-tender, non-distended, no rebound/guarding Skin: umbilical incision is well healed, no erythema or drainage   Assessment/Plan: This is a 57 y.o. male 17 days s/p umbilical hernia repair   - Pain control prn  - Reviewed wound care recommendation  - Reviewed lifting restrictions; 6 weeks total  - He can follow up on as needed basis; He understands to call with questions/concerns  -- Lynden Oxford, PA-C [facility] 02/27/2023, 1:46 PM M-F: 7am - 4pm

## 2023-02-27 NOTE — Patient Instructions (Signed)

## 2023-04-25 ENCOUNTER — Ambulatory Visit: Payer: BC Managed Care – PPO | Admitting: Family Medicine

## 2023-04-25 ENCOUNTER — Encounter: Payer: Self-pay | Admitting: Family Medicine

## 2023-04-25 VITALS — BP 131/93 | HR 74 | Temp 98.4°F | Resp 16 | Ht 74.0 in | Wt 254.0 lb

## 2023-04-25 DIAGNOSIS — R0989 Other specified symptoms and signs involving the circulatory and respiratory systems: Secondary | ICD-10-CM

## 2023-04-25 DIAGNOSIS — J0100 Acute maxillary sinusitis, unspecified: Secondary | ICD-10-CM | POA: Diagnosis not present

## 2023-04-25 MED ORDER — AMOXICILLIN 500 MG PO CAPS
500.0000 mg | ORAL_CAPSULE | Freq: Two times a day (BID) | ORAL | 0 refills | Status: DC
Start: 2023-04-25 — End: 2023-06-02

## 2023-04-25 MED ORDER — AMOXICILLIN 500 MG PO CAPS
500.0000 mg | ORAL_CAPSULE | Freq: Two times a day (BID) | ORAL | 0 refills | Status: DC
Start: 2023-04-25 — End: 2023-04-25

## 2023-04-25 MED ORDER — FLUTICASONE PROPIONATE 50 MCG/ACT NA SUSP: 2.0000 | Freq: Every day | NASAL | 0 refills | Status: DC

## 2023-04-25 NOTE — Progress Notes (Signed)
      Established patient visit   Patient: Shaun Holland   DOB: 02/25/1966   57 y.o. Male  MRN: [id number] Visit Date: 04/25/2023  Today's healthcare provider: Mila Merry, MD   Chief Complaint  Patient presents with   Sinusitis    Started 2 weeks ago-stuffy nose, going into chest coughing, headaches, has not taken a covid test. OTC dayquil and allergy meds no help. He did not have a fever at all. Dizzy and chills today.  Completed covid nasal swab at 3:39 pm   Subjective    Discussed the use of AI scribe software for clinical note transcription with the patient, who gave verbal consent to proceed.  History of Present Illness   The patient, with a history of sinus infections, presents with a two-week history of sinus symptoms that have not resolved with over-the-counter treatments. He describes the symptoms as 'regular sinus,' with recent onset of cough, chills, and a sore throat. He denies fever and sweats but reports feeling cold. He has also experienced shortness of breath. He has been self-treating with Dayquil, Claritin, and Tylenol but has not used any nasal or sinus sprays.       Medications: Outpatient Medications Prior to Visit  Medication Sig   rosuvastatin (CRESTOR) 10 MG tablet Take 1 tablet (10 mg total) by mouth daily.   No facility-administered medications prior to visit.      Objective    BP (!) 131/93   Pulse 74   Temp 98.4 F (36.9 C) (Oral)   Resp 16   Ht 6\' 2"  (1.88 m)   Wt 254 lb (115.2 kg)   SpO2 98%   BMI 32.61 kg/m   Physical Exam   HEENT: Nasal passages not congested. Throat erythematous. Sinuses tender below the eyes, not tender above the eyes.      Assessment & Plan    1. Upper respiratory symptom Covid test negative.   2. Acute non-recurrent maxillary sinusitis  - amoxicillin (AMOXIL) 500 MG capsule; Take 1 capsule (500 mg total) by mouth 2 (two) times daily.  Dispense: 20 capsule; Refill: 0  - fluticasone (FLONASE) 50  MCG/ACT nasal spray; Place 2 sprays into both nostrils daily.  Dispense: 16 g; Refill: 0      Mila Merry, MD  [facility] [phone] (phone) [phone] (fax)  [facility]

## 2023-05-15 ENCOUNTER — Ambulatory Visit: Payer: BC Managed Care – PPO | Admitting: Physician Assistant

## 2023-05-15 VITALS — BP 140/107 | HR 77 | Temp 97.9°F | Ht 74.5 in | Wt 254.8 lb

## 2023-05-15 DIAGNOSIS — R059 Cough, unspecified: Secondary | ICD-10-CM

## 2023-05-15 MED ORDER — BENZONATATE 100 MG PO CAPS
200.0000 mg | ORAL_CAPSULE | Freq: Three times a day (TID) | ORAL | 0 refills | Status: DC | PRN
Start: 2023-05-15 — End: 2023-06-02

## 2023-05-15 MED ORDER — IPRATROPIUM BROMIDE 0.03 % NA SOLN
2.0000 | Freq: Two times a day (BID) | NASAL | 0 refills | Status: DC
Start: 2023-05-15 — End: 2023-06-02

## 2023-05-15 MED ORDER — PROMETHAZINE-DM 6.25-15 MG/5ML PO SYRP
5.0000 mL | ORAL_SOLUTION | Freq: Four times a day (QID) | ORAL | 0 refills | Status: DC | PRN
Start: 2023-05-15 — End: 2023-06-02

## 2023-05-15 NOTE — Progress Notes (Signed)
Established patient visit  Patient: Shaun Holland   DOB: 06-07-1966   57 y.o. Male  MRN: [id number] Visit Date: 05/15/2023  Today's healthcare provider: Debera Lat, PA-C   Chief Complaint  Patient presents with   Cough    For 3 weeks   Subjective     HPI     Cough    Additional comments: For 3 weeks      Last edited by Barrie Lyme on 05/15/2023  9:36 AM.      Was seen by Dr. Sherrie Mustache on 04/25/23 for acute non-recurrent maxillary sinusitis. Stuffy nose, going into chest congestion, headaches, dizziness and chills are gone Covid test was negative  Former smoker. Quit in 2015    05/15/2023    9:41 AM 04/25/2023    3:22 PM 01/17/2023    9:35 AM  Depression screen PHQ 2/9  Decreased Interest 0 0 0  Down, Depressed, Hopeless 0 1 0  PHQ - 2 Score 0 1 0  Altered sleeping 1 1   Tired, decreased energy 2 1   Change in appetite 0 0   Feeling bad or failure about yourself  0 0   Trouble concentrating 0 0   Moving slowly or fidgety/restless 0 0   Suicidal thoughts 0 0   PHQ-9 Score 3 3       05/15/2023    9:41 AM 04/25/2023    3:22 PM  GAD 7 : Generalized Anxiety Score  Nervous, Anxious, on Edge 0 0  Control/stop worrying 0 0  Worry too much - different things 0 1  Trouble relaxing 0 1  Restless 0 1  Easily annoyed or irritable 0 1  Afraid - awful might happen 0 1  Total GAD 7 Score 0 5  Anxiety Difficulty Not difficult at all Not difficult at all    Medications: Outpatient Medications Prior to Visit  Medication Sig   rosuvastatin (CRESTOR) 10 MG tablet Take 1 tablet (10 mg total) by mouth daily.   amoxicillin (AMOXIL) 500 MG capsule Take 1 capsule (500 mg total) by mouth 2 (two) times daily. (Patient not taking: Reported on 05/15/2023)   fluticasone (FLONASE) 50 MCG/ACT nasal spray Place 2 sprays into both nostrils daily. (Patient not taking: Reported on 05/15/2023)   No facility-administered medications prior to visit.    Review of Systems   All other systems reviewed and are negative.  Except see HPI       Objective    BP (!) 140/107 (BP Location: Right Arm, Patient Position: Sitting, Cuff Size: Normal)   Pulse 77   Temp 97.9 F (36.6 C) (Oral)   Ht 6' 2.5" (1.892 m)   Wt 254 lb 12.8 oz (115.6 kg)   SpO2 99%   BMI 32.28 kg/m     Physical Exam Vitals reviewed.  Constitutional:      General: He is in acute distress.     Appearance: Normal appearance. He is not ill-appearing, toxic-appearing or diaphoretic.  HENT:     Head: Normocephalic and atraumatic.     Right Ear: Tympanic membrane, ear canal and external ear normal.     Left Ear: Tympanic membrane, ear canal and external ear normal.     Nose: Congestion and rhinorrhea present.     Mouth/Throat:     Pharynx: Posterior oropharyngeal erythema present.  Eyes:     General: No scleral icterus.       Right eye: No discharge.        Left eye:  No discharge.     Extraocular Movements: Extraocular movements intact.     Conjunctiva/sclera: Conjunctivae normal.     Pupils: Pupils are equal, round, and reactive to light.  Cardiovascular:     Rate and Rhythm: Normal rate and regular rhythm.     Pulses: Normal pulses.     Heart sounds: Normal heart sounds. No murmur heard. Pulmonary:     Effort: Pulmonary effort is normal. No respiratory distress.     Breath sounds: Normal breath sounds. No wheezing or rhonchi.  Abdominal:     General: Abdomen is flat. Bowel sounds are normal.     Palpations: Abdomen is soft.  Musculoskeletal:        General: Normal range of motion.     Cervical back: Normal range of motion and neck supple.     Right lower leg: No edema.     Left lower leg: No edema.  Lymphadenopathy:     Cervical: No cervical adenopathy.  Skin:    General: Skin is warm and dry.     Findings: No rash.  Neurological:     General: No focal deficit present.     Mental Status: He is alert and oriented to person, place, and time. Mental status is at baseline.   Psychiatric:        Behavior: Behavior normal.        Thought Content: Thought content normal.      No results found for any visits on 05/15/23.  Assessment & Plan    1. Cough in adult X 3 weeks Was seen on 04/25/23 for sinusitis, was treated with amoxil 500mg  BID for 10 days and Flonase. Improved except persistent cough Advised to try any of the following: - ipratropium (ATROVENT) 0.03 % nasal spray; Place 2 sprays into both nostrils every 12 (twelve) hours.  Dispense: 30 mL; Refill: 0 - benzonatate (TESSALON PERLES) 100 MG capsule; Take 2 capsules (200 mg total) by mouth 3 (three) times daily as needed for cough.  Dispense: 20 capsule; Refill: 0 - promethazine-dextromethorphan (PROMETHAZINE-DM) 6.25-15 MG/5ML syrup; Take 5 mLs by mouth 4 (four) times daily as needed for cough.  Dispense: 118 mL; Refill: 0 Will reassess  Return in about 2 weeks (around 05/29/2023) for BP f/u.    The patient was advised to call back or seek an in-person evaluation if the symptoms worsen or if the condition fails to improve as anticipated.  I discussed the assessment and treatment plan with the patient. The patient was provided an opportunity to ask questions and all were answered. The patient agreed with the plan and demonstrated an understanding of the instructions.  I, Debera Lat, PA-C have reviewed all documentation for this visit. The documentation on  05/15/23 for the exam, diagnosis, procedures, and orders are all accurate and complete.  Debera Lat, [facility], MMS [facility] [phone] (phone) [phone] (fax)   [facility]

## 2023-05-16 ENCOUNTER — Encounter: Payer: Self-pay | Admitting: Physician Assistant

## 2023-05-22 ENCOUNTER — Other Ambulatory Visit: Payer: Self-pay | Admitting: Family Medicine

## 2023-05-23 NOTE — Telephone Encounter (Signed)
Requested Prescriptions  Pending Prescriptions Disp Refills   fluticasone (FLONASE) 50 MCG/ACT nasal spray [Pharmacy Med Name: FLUTICASONE NASAL SP (120) RX] 16 g 0    Sig: SHAKE LIQUID AND USE 2 SPRAYS IN EACH NOSTRIL DAILY     Ear, Nose, and Throat: Nasal Preparations - Corticosteroids Passed - 05/22/2023  3:10 AM      Passed - Valid encounter within last 12 months    Recent Outpatient Visits           1 week ago Cough in adult   [facility] [place], [place], PA-C   4 weeks ago Upper respiratory symptom   [facility] Malva Limes, MD   4 months ago Umbilical hernia without obstruction and without gangrene   [facility] Malva Limes, MD   2 years ago Benign paroxysmal positional vertigo, unspecified laterality   [facility] Malva Limes, MD   4 years ago Bronchitis   [facility] Malva Limes, MD       Future Appointments             In 1 week Fisher, Demetrios Isaacs, MD [facility]   In 8 months Richardo Hanks, Laurette Schimke, MD [facility] [place]

## 2023-05-27 ENCOUNTER — Other Ambulatory Visit: Payer: Self-pay | Admitting: Family Medicine

## 2023-05-27 DIAGNOSIS — E782 Mixed hyperlipidemia: Secondary | ICD-10-CM

## 2023-06-02 ENCOUNTER — Ambulatory Visit: Payer: BC Managed Care – PPO | Admitting: Family Medicine

## 2023-06-02 VITALS — BP 148/95 | HR 73 | Resp 16 | Ht 74.5 in | Wt 259.0 lb

## 2023-06-02 DIAGNOSIS — I10 Essential (primary) hypertension: Secondary | ICD-10-CM

## 2023-06-02 MED ORDER — HYDROCHLOROTHIAZIDE 12.5 MG PO TABS: 12.5000 mg | ORAL_TABLET | Freq: Every day | ORAL | 1 refills | Status: DC

## 2023-06-02 NOTE — Patient Instructions (Signed)
.   Please review the attached list of medications and notify my office if there are any errors.   . Please bring all of your medications to every appointment so we can make sure that our medication list is the same as yours.   

## 2023-06-03 NOTE — Progress Notes (Signed)
      Established patient visit   Patient: Shaun Holland   DOB: 1965/09/24   57 y.o. Male  MRN: [id number] Visit Date: 06/02/2023  Today's healthcare provider: Mila Merry, MD    Subjective    HPI  Presents for blood pressure check noted to have consistently mildly elevated BP last several visit.  BP Readings from Last 5 Encounters:  06/02/23 (!) 148/95  05/15/23 (!) 140/107  04/25/23 (!) 131/93  02/27/23 134/78  02/10/23 (!) 145/85   Checking BP at work with automatic brachial cuff and consistently in the 140s/90s, occasionally SBP in the 130s, and occasional diastolics in the 100s. Limits added salt in diet and tries to consume healthy amount of fruits and vegies.  Lab Results  Component Value Date   NA 142 01/17/2023   K 5.1 01/17/2023   CREATININE 1.10 01/17/2023   EGFR 79 01/17/2023   GLUCOSE 99 01/17/2023     Medications: Outpatient Medications Prior to Visit  Medication Sig   rosuvastatin (CRESTOR) 10 MG tablet TAKE 1 TABLET(10 MG) BY MOUTH DAILY   [DISCONTINUED] amoxicillin (AMOXIL) 500 MG capsule Take 1 capsule (500 mg total) by mouth 2 (two) times daily. (Patient not taking: Reported on 05/15/2023)   [DISCONTINUED] benzonatate (TESSALON PERLES) 100 MG capsule Take 2 capsules (200 mg total) by mouth 3 (three) times daily as needed for cough.   [DISCONTINUED] fluticasone (FLONASE) 50 MCG/ACT nasal spray SHAKE LIQUID AND USE 2 SPRAYS IN EACH NOSTRIL DAILY   [DISCONTINUED] ipratropium (ATROVENT) 0.03 % nasal spray Place 2 sprays into both nostrils every 12 (twelve) hours.   [DISCONTINUED] promethazine-dextromethorphan (PROMETHAZINE-DM) 6.25-15 MG/5ML syrup Take 5 mLs by mouth 4 (four) times daily as needed for cough.   No facility-administered medications prior to visit.       Objective    BP (!) 148/95   Pulse 73   Resp 16   Ht 6' 2.5" (1.892 m)   Wt 259 lb (117.5 kg)   SpO2 97%   BMI 32.81 kg/m    Physical Exam   General: Appearance:     Overweight male in no acute distress  Eyes:    PERRL, conjunctiva/corneas clear, EOM's intact       Lungs:     Clear to auscultation bilaterally, respirations unlabored  Heart:    Normal heart rate. Normal rhythm. No murmurs, rubs, or gallops.    MS:   All extremities are intact.    Neurologic:   Awake, alert, oriented x 3. No apparent focal neurological defect.         Assessment & Plan     1. Primary hypertension Info on DASH diet. Potassium is borderline high, will start hydrochlorothiazide (HYDRODIURIL) 12.5 MG tablet; Take 1 tablet (12.5 mg total) by mouth daily.  Dispense: 30 tablet; Refill: 1   Consider adding ARB if potasium comes done.   Return in about 1 month (around 07/03/2023).        Mila Merry, MD  [facility] [phone] (phone) [phone] (fax)  [facility]

## 2023-06-26 ENCOUNTER — Other Ambulatory Visit: Payer: Self-pay | Admitting: Physician Assistant

## 2023-06-26 DIAGNOSIS — R059 Cough, unspecified: Secondary | ICD-10-CM

## 2023-07-04 ENCOUNTER — Ambulatory Visit: Payer: 59 | Admitting: Family Medicine

## 2023-07-04 VITALS — BP 128/82 | HR 81 | Resp 18 | Ht 74.5 in | Wt 260.0 lb

## 2023-07-04 DIAGNOSIS — I10 Essential (primary) hypertension: Secondary | ICD-10-CM

## 2023-07-04 DIAGNOSIS — E782 Mixed hyperlipidemia: Secondary | ICD-10-CM

## 2023-07-04 MED ORDER — AMLODIPINE BESYLATE 5 MG PO TABS: 5.0000 mg | ORAL_TABLET | Freq: Every day | ORAL | 1 refills | Status: DC

## 2023-07-04 NOTE — Patient Instructions (Addendum)
Please review the attached list of medications and notify my office if there are any errors.   Please go to the lab draw station in [address] on the second floor of [facility]  when you are fasting for 8 hours. Normal hours are 8:00am to 11:30am and 1:00pm to 4:00pm Monday through Friday     

## 2023-07-04 NOTE — Progress Notes (Signed)
      Established patient visit   Patient: Shaun Holland   DOB: Mar 19, 1966   58 y.o. Male  MRN: [id number] Visit Date: 07/04/2023  Today's healthcare provider: Nancyann Perry, MD   Chief Complaint  Patient presents with   Hypertension   Subjective    HPI Follow up htn and lipids. Started hydrochlorothiazide  last month, but immediately started having severe anxiety. Anxiety went away when he stopped medication. Started back on It this week and anxiety has returned. He taking rosuvastatin  consistently for htn and tolerating well.   BP Readings from Last 3 Encounters:  07/04/23 128/82  06/02/23 (!) 148/95  05/15/23 (!) 140/107   Lab Results  Component Value Date   CHOL 236 (H) 01/17/2023   HDL 47 01/17/2023   LDLCALC 168 (H) 01/17/2023   TRIG 118 01/17/2023   CHOLHDL 5.0 01/17/2023     Medications: Outpatient Medications Prior to Visit  Medication Sig Note   ipratropium (ATROVENT ) 0.03 % nasal spray USE 2 SPRAYS IN EACH NOSTRIL EVERY 12 HOURS    rosuvastatin  (CRESTOR ) 10 MG tablet TAKE 1 TABLET(10 MG) BY MOUTH DAILY    [DISCONTINUED] hydrochlorothiazide  (HYDRODIURIL ) 12.5 MG tablet Take 1 tablet (12.5 mg total) by mouth daily. (Patient not taking: Reported on 07/04/2023) 07/04/2023: anxiety   No facility-administered medications prior to visit.      Objective    BP 128/82   Pulse 81   Resp 18   Ht 6' 2.5 (1.892 m)   Wt 260 lb (117.9 kg)   SpO2 100%   BMI 32.94 kg/m    Physical Exam  Awake, alert, oriented x 3. In no apparent distress   Assessment & Plan     1. Primary hypertension (Primary) Since starting hydrochlorothiazide  bp is much better but having anxiety when he takes it, anxiety resolved when he briefly stopped medication.   Change to amLODipine  (NORVASC ) 5 MG tablet; Take 1 tablet (5 mg total) by mouth daily.  Dispense: 30 tablet; Refill: 1  2. Hyperlipidemia, mixed He is tolerating rosuvastatin  well with no adverse effects.   -  Comprehensive metabolic panel - Lipid panel   Return in about 6 weeks (around 08/15/2023) for Hypertension.         Nancyann Perry, MD  [facility] [phone] (phone) [phone] (fax)  [facility]

## 2023-07-15 LAB — COMPREHENSIVE METABOLIC PANEL
ALT: 18 [IU]/L (ref 0–44)
AST: 16 [IU]/L (ref 0–40)
Albumin: 4.4 g/dL (ref 3.8–4.9)
Alkaline Phosphatase: 47 [IU]/L (ref 44–121)
BUN/Creatinine Ratio: 19 (ref 9–20)
BUN: 19 mg/dL (ref 6–24)
Bilirubin Total: 0.4 mg/dL (ref 0.0–1.2)
CO2: 23 mmol/L (ref 20–29)
Calcium: 9.8 mg/dL (ref 8.7–10.2)
Chloride: 107 mmol/L — ABNORMAL HIGH (ref 96–106)
Creatinine, Ser: 1.02 mg/dL (ref 0.76–1.27)
Globulin, Total: 2.8 g/dL (ref 1.5–4.5)
Glucose: 130 mg/dL — ABNORMAL HIGH (ref 70–99)
Potassium: 4.9 mmol/L (ref 3.5–5.2)
Sodium: 144 mmol/L (ref 134–144)
Total Protein: 7.2 g/dL (ref 6.0–8.5)
eGFR: 86 mL/min/{1.73_m2} (ref >59)

## 2023-07-15 LAB — LIPID PANEL
Chol/HDL Ratio: 3.7 {ratio} (ref 0.0–5.0)
Cholesterol, Total: 164 mg/dL (ref 100–199)
HDL: 44 mg/dL (ref >39)
LDL Chol Calc (NIH): 98 mg/dL (ref 0–99)
Triglycerides: 121 mg/dL (ref 0–149)
VLDL Cholesterol Cal: 22 mg/dL (ref 5–40)

## 2023-08-18 ENCOUNTER — Ambulatory Visit: Payer: Self-pay | Admitting: Family Medicine

## 2023-08-29 ENCOUNTER — Encounter: Payer: Self-pay | Admitting: Family Medicine

## 2023-08-29 ENCOUNTER — Ambulatory Visit: Payer: 59 | Admitting: Family Medicine

## 2023-08-29 DIAGNOSIS — I10 Essential (primary) hypertension: Secondary | ICD-10-CM | POA: Diagnosis not present

## 2023-08-29 MED ORDER — AMLODIPINE BESYLATE 5 MG PO TABS
5.0000 mg | ORAL_TABLET | Freq: Every day | ORAL | 3 refills | Status: AC
Start: 2023-08-29 — End: ?

## 2023-08-29 NOTE — Patient Instructions (Signed)
 Marland Kitchen  Please review the attached list of medications and notify my office if there are any errors.   . Please bring all of your medications to every appointment so we can make sure that our medication list is the same as yours.

## 2023-08-29 NOTE — Progress Notes (Signed)
      Established patient visit   Patient: Shaun Holland   DOB: [birth year]   58 y.o. Male  MRN: [id number] Visit Date: 08/29/2023  Today's healthcare provider: Mila Merry, MD   Chief Complaint  Patient presents with   Hypertension   Subjective    Hypertension Pertinent negatives include no chest pain, palpitations or shortness of breath.    Follow up hypertension since changing hydrochlorothiazide to amlodipine in early January due to side effects. He is having no apparent adverse effects from amlodipine. Has started working out with a fitness trainer and trying to stick to a healthy diet. 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    Medications: Outpatient Medications Prior to Visit  Medication Sig   ipratropium (ATROVENT) 0.03 % nasal spray USE 2 SPRAYS IN EACH NOSTRIL EVERY 12 HOURS   rosuvastatin (CRESTOR) 10 MG tablet TAKE 1 TABLET(10 MG) BY MOUTH DAILY   [DISCONTINUED] amLODipine (NORVASC) 5 MG tablet Take 1 tablet (5 mg total) by mouth daily.   No facility-administered medications prior to visit.    Review of Systems  Constitutional:  Negative for appetite change, chills and fever.  Respiratory:  Negative for chest tightness, shortness of breath and wheezing.   Cardiovascular:  Negative for chest pain and palpitations.  Gastrointestinal:  Negative for abdominal pain, nausea and vomiting.       Objective    BP 137/88 (BP Location: Left Arm, Patient Position: Sitting, Cuff Size: Large)   Pulse 75   Resp 16   Ht 6' 2.75" (1.899 m)   Wt 257 lb 12.8 oz (116.9 kg)   BMI 32.44 kg/m  BP Readings from Last 3 Encounters:  08/29/23 137/88  07/04/23 128/82  06/02/23 (!) 148/95   Wt Readings from Last 3 Encounters:  08/29/23 257 lb 12.8 oz (116.9 kg)  07/04/23 260 lb (117.9 kg)  06/02/23 259 lb (117.5 kg)    Physical Exam  General appearance: Overweight male, cooperative and in no acute distress Head:  Normocephalic, without obvious abnormality, atraumatic Respiratory: Respirations even and unlabored, normal respiratory rate Extremities: All extremities are intact.  Skin: Skin color, texture, turgor normal. No rashes seen  Psych: Appropriate mood and affect. Neurologic: Mental status: Alert, oriented to person, place, and time, thought content appropriate.    Assessment & Plan     1. Primary hypertension Doing much with amlodipine than hydrochlorothiazide.   refill amLODipine (NORVASC) 5 MG tablet; Take 1 tablet (5 mg total) by mouth daily.  Dispense: 90 tablet; Refill: 3   Return in about 20 weeks (around 01/16/2024) for Yearly Physical.         Mila Merry, MD  [facility] [phone] (phone) [phone] (fax)  [facility]

## 2023-09-24 ENCOUNTER — Ambulatory Visit: Admitting: Family Medicine

## 2023-09-24 VITALS — BP 129/88 | HR 82 | Temp 98.6°F | Ht 75.0 in | Wt 252.0 lb

## 2023-09-24 DIAGNOSIS — R6889 Other general symptoms and signs: Secondary | ICD-10-CM

## 2023-09-24 LAB — POC COVID19/FLU A&B COMBO
Covid Antigen, POC: NEGATIVE
Influenza A Antigen, POC: NEGATIVE
Influenza B Antigen, POC: NEGATIVE

## 2023-09-24 NOTE — Progress Notes (Signed)
 Established patient visit   Patient: Shaun Holland   DOB: 1965/12/05   58 y.o. Male  MRN: [id number] Visit Date: 09/24/2023  Today's healthcare provider: Sherlyn Hay, DO   Chief Complaint  Patient presents with   Nasal Congestion   Chills   Headache    Pt reports feeling ache   Subjective    Headache  Associated symptoms include coughing and sinus pressure. Pertinent negatives include no abdominal pain, ear pain, nausea, sore throat or vomiting.    Shaun Holland is a 58 year old male who presents with chills, headache, and congestion.  He has been experiencing chills, headache, and congestion since last night. The headache is described as nagging but not severe. He attempted to alleviate the chills by taking a hot shower. No fever is reported, but he confirms body aches, cough, sneezing, sinus pain, and sinus pressure.  He mentions having seasonal allergies but states that chills and body aches are not typical symptoms for him. 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  He reports fatigue and attributes some shortness of breath to congestion. He has been eating normally and denies any trouble eating or drinking.     Medications: Outpatient Medications Prior to Visit  Medication Sig   amLODipine (NORVASC) 5 MG tablet Take 1 tablet (5 mg total) by mouth daily.   rosuvastatin (CRESTOR) 10 MG tablet TAKE 1 TABLET(10 MG) BY MOUTH DAILY   ipratropium (ATROVENT) 0.03 % nasal spray USE 2 SPRAYS IN EACH NOSTRIL EVERY 12 HOURS (Patient not taking: Reported on 09/24/2023)   No facility-administered medications prior to visit.    Review of Systems  Constitutional:  Positive for chills. Negative for fatigue.  HENT:  Positive for congestion, sinus pressure, sinus pain and sneezing. Negative for ear pain and sore throat.    Respiratory:  Positive for cough. Negative for shortness of breath.   Cardiovascular:  Negative for chest pain.  Gastrointestinal:  Negative for abdominal pain, diarrhea, nausea and vomiting.  Skin:  Negative for pallor.  Neurological:  Positive for headaches.        Objective    BP 129/88   Pulse 82   Temp 98.6 F (37 C)   Ht 6\' 3"  (1.905 m)   Wt 252 lb (114.3 kg)   SpO2 99%   BMI 31.50 kg/m     Physical Exam Constitutional:      Appearance: Normal appearance.  HENT:     Head: Normocephalic and atraumatic.     Right Ear: Tympanic membrane, ear canal and external ear normal.     Left Ear: Tympanic membrane, ear canal and external ear normal.     Nose: Congestion and rhinorrhea present.     Mouth/Throat:     Mouth: Mucous membranes are moist.     Pharynx: Oropharynx is clear.  Eyes:     General: No scleral icterus.    Extraocular Movements: Extraocular movements intact.     Conjunctiva/sclera: Conjunctivae normal.  Cardiovascular:     Rate and Rhythm: Normal rate and regular rhythm.     Pulses: Normal pulses.     Heart sounds: Normal heart sounds.  Pulmonary:     Effort: Pulmonary effort is normal. No respiratory distress.     Breath sounds: Normal breath sounds.  Abdominal:     General: Bowel sounds are  normal. There is no distension.     Palpations: Abdomen is soft. There is no mass.     Tenderness: There is no abdominal tenderness. There is no guarding.  Musculoskeletal:     Right lower leg: No edema.     Left lower leg: No edema.  Skin:    General: Skin is warm and dry.  Neurological:     Mental Status: He is alert and oriented to person, place, and time. Mental status is at baseline.  Psychiatric:        Mood and Affect: Mood normal.        Behavior: Behavior normal.      Results for orders placed or performed in visit on 09/24/23  POC Covid19/Flu A&B Antigen  Result Value Ref Range   Influenza A Antigen, POC Negative Negative   Influenza B  Antigen, POC Negative Negative   Covid Antigen, POC Negative Negative    Assessment & Plan    Flu-like symptoms -     POC Covid19/Flu A&B Antigen   Acute onset of chills, headache, congestion, cough, sneezing, sinus pain, and pressure. Differential includes viral infection or allergies. Chills and body aches suggest viral etiology over allergic origin. No pneumonia signs. - Covid/Flu A/B swab negative - Recommend supportive care as detailed in AVS.  Return if symptoms worsen or fail to improve.      I discussed the assessment and treatment plan with the patient  The patient was provided an opportunity to ask questions and all were answered. The patient agreed with the plan and demonstrated an understanding of the instructions.   The patient was advised to call back or seek an in-person evaluation if the symptoms worsen or if the condition fails to improve as anticipated.    Sherlyn Hay, DO  [facility] [phone] (phone) [phone] (fax)  [facility]

## 2023-09-24 NOTE — Patient Instructions (Signed)
 Your symptoms today are most likely being caused by a virus and should steadily improve in time it can take up to 7 to 10 days before you truly start to see a turnaround however things will get better   COVID and flu testing are negative.  You can take Tylenol and/or Ibuprofen as needed for fever reduction and pain relief.   For cough: honey 1/2 to 1 teaspoon (you can dilute the honey in water or another fluid).  You can also use guaifenesin and dextromethorphan for cough.   - You can use a humidifier for chest congestion and cough.  If you don't have a humidifier, you can sit in the bathroom with the hot shower running.      For sore throat: try warm salt water gargles, cepacol lozenges, throat spray, warm tea or water with lemon/honey, popsicles or ice, or OTC cold relief medicine for throat discomfort.   For congestion:   - take a daily anti-histamine like Zyrtec (cetirizine), Claritin (loratadine), or Allegra (fexofenadine),   - If you do NOT have high blood pressure, you can also take an oral decongestant, such as pseudoephedrine (for 30 mg tablets, take 1-2 tabs every 4-6 hours).    - You can also use Flonase 1-2 sprays in each nostril daily.   It is important to stay hydrated: drink plenty of fluids (water, gatorade/powerade/pedialyte, juices, or teas) to keep your throat moisturized and help further relieve irritation/discomfort.  These should be sugar-free if you are diabetic.

## 2023-10-06 ENCOUNTER — Encounter: Payer: Self-pay | Admitting: Family Medicine

## 2024-01-19 ENCOUNTER — Other Ambulatory Visit
Admission: RE | Admit: 2024-01-19 | Discharge: 2024-01-19 | Disposition: A | Discharge status: Home / Self Care | Attending: Urology | Admitting: Urology

## 2024-01-19 DIAGNOSIS — R972 Elevated prostate specific antigen [PSA]: Secondary | ICD-10-CM | POA: Insufficient documentation

## 2024-01-20 LAB — PSA, TOTAL AND FREE
PSA, Free Pct: 21.6 %
PSA, Free: 0.93 ng/mL
Prostate Specific Ag, Serum: 4.3 ng/mL — ABNORMAL HIGH (ref 0.0–4.0)

## 2024-01-21 ENCOUNTER — Other Ambulatory Visit: Payer: Self-pay

## 2024-01-21 ENCOUNTER — Ambulatory Visit: Payer: Self-pay | Admitting: Urology

## 2024-01-21 VITALS — BP 149/95 | HR 93 | Ht 74.0 in | Wt 258.0 lb

## 2024-01-21 DIAGNOSIS — Z125 Encounter for screening for malignant neoplasm of prostate: Secondary | ICD-10-CM

## 2024-01-21 NOTE — Progress Notes (Signed)
   01/21/2024 9:08 AM   Shaun Holland [birth year] [id number]  Reason for visit: Follow up elevated PSA  HPI: Healthy 58 year old male with no family history of prostate cancer and no urinary symptoms who was originally referred February 2023 for a mildly elevated PSA of 4.6.  He also had a PSA of 4.3 with 20% free in July 2022.  DRE was benign.  Repeat PSA on 01/11/2022 returned to normal at 3.9.  Most recent PSA from July 2025 4.3, 22% free, down from 5.1 in July 2024.  We do long conversation about the AUA guidelines regarding the risks and benefits of PSA screening, as well as his overall reassuring trend that has ranged from 3.9-5.1 over the last 3 to 4 years.  We reviewed other options including prostate MRI or prostate biopsy, using shared decision making he opted for repeat PSA with reflex to free in 1 year which I think is very reasonable.  We discussed the very low, but not 0, risk of missing a clinically significant prostate cancer by deferring MRI or biopsy at this time.  RTC 1 year PSA reflex to free prior.  Consider prostate MRI in the future if PSA rises or percentage free decreases  [name], MD   [facility] [address] [place], [place] [address] [phone]

## 2024-01-26 ENCOUNTER — Ambulatory Visit (INDEPENDENT_AMBULATORY_CARE_PROVIDER_SITE_OTHER): Payer: 59 | Admitting: Family Medicine

## 2024-01-26 ENCOUNTER — Encounter: Payer: Self-pay | Admitting: Family Medicine

## 2024-01-26 VITALS — BP 136/92 | HR 73 | Temp 98.2°F | Ht 74.0 in | Wt 261.9 lb

## 2024-01-26 DIAGNOSIS — Z8249 Family history of ischemic heart disease and other diseases of the circulatory system: Secondary | ICD-10-CM | POA: Diagnosis not present

## 2024-01-26 DIAGNOSIS — R972 Elevated prostate specific antigen [PSA]: Secondary | ICD-10-CM

## 2024-01-26 DIAGNOSIS — Z1211 Encounter for screening for malignant neoplasm of colon: Secondary | ICD-10-CM

## 2024-01-26 DIAGNOSIS — Z0000 Encounter for general adult medical examination without abnormal findings: Secondary | ICD-10-CM | POA: Diagnosis not present

## 2024-01-26 DIAGNOSIS — Z1159 Encounter for screening for other viral diseases: Secondary | ICD-10-CM

## 2024-01-26 DIAGNOSIS — E782 Mixed hyperlipidemia: Secondary | ICD-10-CM

## 2024-01-26 DIAGNOSIS — I10 Essential (primary) hypertension: Secondary | ICD-10-CM | POA: Insufficient documentation

## 2024-01-26 DIAGNOSIS — M79672 Pain in left foot: Secondary | ICD-10-CM

## 2024-01-26 DIAGNOSIS — R739 Hyperglycemia, unspecified: Secondary | ICD-10-CM

## 2024-01-26 NOTE — Progress Notes (Signed)
 Complete physical exam   Patient: Shaun Holland   DOB: Mar 19, 1966   58 y.o. Male  MRN: [id number] Visit Date: 01/26/2024  Today's healthcare provider: Nancyann Perry, MD   Chief Complaint  Patient presents with   Annual Exam    Diet: Normal  Exercise: Walking x5-6 days week. 3mi  Sleep: Issues falling asleep  Overall Feeling: Fine, could be better    Subjective    Discussed the use of AI scribe software for clinical note transcription with the patient, who gave verbal consent to proceed.  History of Present Illness   Shaun Holland is a 58 year old male with hypertensin and hyperlipdiemia who presents for an annual physical exam.  He has been experiencing left foot pain for the past month, with persistent pain primarily located in the foot and extending upwards. The affected foot feels swollen compared to the other foot. He has been using Tylenol  for pain relief and performing stretching exercises. A podiatrist diagnosed the condition and prescribed an anti-inflammatory medication, but no x-rays were taken.  He has a history of elevated PSA levels, currently being monitored annually by Dr. Etheleen.   He has hypertension and is currently taking amlodipine  for blood pressure management. A previous medication caused anxiety, leading to the switch. He does not regularly check his blood pressure at home but occasionally does so at school. No chest pain, shortness of breath, or palpitations.  He has been off of rosuvastatin  for a few months and is attempting to manage his cholesterol through dietary changes, including increased intake of vegetables and fruits.  He has not yet undergone a colonoscopy but plans to schedule one in the spring when his [employer] account is replenished to cover the cost.  He states he received the hepatitis B vaccine approximately eight to nine years ago due to occupational requirements, but he has not brought in the records. He recalls  receiving two doses as part of the vaccination series.  He quit smoking about ten years ago.  No hearing issues or tinnitus. He reports cramping and changes in stool consistency, attributing it to his blood pressure medication.     Lab Results  Component Value Date   CHOL 164 07/14/2023   HDL 44 07/14/2023   LDLCALC 98 07/14/2023   TRIG 121 07/14/2023   CHOLHDL 3.7 07/14/2023   Lab Results  Component Value Date   NA 144 07/14/2023   K 4.9 07/14/2023   CREATININE 1.02 07/14/2023   EGFR 86 07/14/2023   GLUCOSE 130 (H) 07/14/2023       Past Medical History:  Diagnosis Date   History of chicken pox    History of mumps    HLD (hyperlipidemia)    Hypertension    Past Surgical History:  Procedure Laterality Date   COLONOSCOPY     Echocardiogram  01/13/2008   [facility]; Normal LVF EF= 66%   HERNIA REPAIR     Myocardial Perfusion Scan  01/13/2008   Done by Dr. Florencio. Normal perfusion on function   UMBILICAL HERNIA REPAIR N/A 02/10/2023   Procedure: HERNIA REPAIR UMBILICAL ADULT, incarcerated hernia, open;  Surgeon: Lane Shope, MD;  Location: [facility];  Service: General;  Laterality: N/A;   Social History   Socioeconomic History   Marital status: Married    Spouse name: Not on file   Number of children: Not on file   Years of education: Not on file   Highest education level: Master's degree (e.g., MA, MS,  MEng, MEd, MSW, MBA)  Occupational History   Occupation: Tax inspector  Tobacco Use   Smoking status: Former    Current packs/day: 0.00    Average packs/day: 0.8 packs/day for 23.4 years (17.6 ttl pk-yrs)    Types: Cigarettes    Start date: 01/30/1992    Quit date: 01/29/2014    Years since quitting: 9.9    Passive exposure: Never   Smokeless tobacco: Never  Vaping Use   Vaping status: Never Used  Substance and Sexual Activity   Alcohol use: Yes    Alcohol/week: 1.0 standard drink of alcohol    Types: 1 Cans of beer per week    Comment: socially    Drug use: No   Sexual activity: Yes    Birth control/protection: None  Other Topics Concern   Not on file  Social History Narrative   Not on file   Social Drivers of Health   Financial Resource Strain: Low Risk  (01/22/2024)   Overall Financial Resource Strain (CARDIA)    Difficulty of Paying Living Expenses: Not hard at all  Food Insecurity: No Food Insecurity (01/22/2024)   Hunger Vital Sign    Worried About Running Out of Food in the Last Year: Never true    Ran Out of Food in the Last Year: Never true  Transportation Needs: No Transportation Needs (01/22/2024)   PRAPARE - Administrator, Civil Service (Medical): No    Lack of Transportation (Non-Medical): No  Physical Activity: Insufficiently Active (01/22/2024)   Exercise Vital Sign    Days of Exercise per Week: 2 days    Minutes of Exercise per Session: 40 min  Stress: No Stress Concern Present (01/22/2024)   [employer] of Occupational Health - Occupational Stress Questionnaire    Feeling of Stress: Only a little  Social Connections: Moderately Integrated (01/22/2024)   Social Connection and Isolation Panel    Frequency of Communication with Friends and Family: More than three times a week    Frequency of Social Gatherings with Friends and Family: Once a week    Attends Religious Services: More than 4 times per year    Active Member of [employer] or Organizations: No    Attends Engineer, structural: Not on file    Marital Status: Married  Catering manager Violence: Not on file   Family Status  Relation Name Status   Father Lyle Niblett Alive   Mother  Alive   Sister  Alive   Brother  Alive   Brother  Alive   Other Uncle Deceased   Other Grandparent Deceased  No partnership data on file   Family History  Problem Relation Age of Onset   Diabetes Father        type 2   Hypertension Father    Hyperlipidemia Father    Stroke Father    Heart attack Other    Heart attack Other         in his 70's   Allergies  Allergen Reactions   Hydrochlorothiazide  Anxiety    Patient Care Team: Gasper Nancyann BRAVO, MD as PCP - General (Family Medicine) Florencio Cara BIRCH, MD as Consulting Physician (Cardiology)   Medications: Outpatient Medications Prior to Visit  Medication Sig   amLODipine  (NORVASC ) 5 MG tablet Take 1 tablet (5 mg total) by mouth daily.   ipratropium (ATROVENT ) 0.03 % nasal spray USE 2 SPRAYS IN EACH NOSTRIL EVERY 12 HOURS   rosuvastatin  (CRESTOR ) 10 MG tablet TAKE 1 TABLET(10 MG)  BY MOUTH DAILY (Patient not taking: Reported on 01/26/2024)   No facility-administered medications prior to visit.    Review of Systems  Constitutional:  Negative for appetite change, chills and fever.  Respiratory:  Negative for chest tightness, shortness of breath and wheezing.   Cardiovascular:  Negative for chest pain and palpitations.  Gastrointestinal:  Negative for abdominal pain, nausea and vomiting.      Objective    BP (!) 136/92 Comment: manual  Pulse 73   Temp 98.2 F (36.8 C) (Oral)   Ht 6' 2 (1.88 m)   Wt 261 lb 14.4 oz (118.8 kg)   SpO2 97%   BMI 33.63 kg/m    Physical Exam   General Appearance:    Mildly obese male. Alert, cooperative, in no acute distress, appears stated age  Head:    Normocephalic, without obvious abnormality, atraumatic  Eyes:    PERRL, conjunctiva/corneas clear, EOM's intact, fundi    benign, both eyes       Ears:    Normal TM's and external ear canals, both ears  Nose:   Nares normal, septum midline, mucosa normal, no drainage   or sinus tenderness  Throat:   Lips, mucosa, and tongue normal; teeth and gums normal  Neck:   Supple, symmetrical, trachea midline, no adenopathy;       thyroid:  No enlargement/tenderness/nodules; no carotid   bruit or JVD  Back:     Symmetric, no curvature, ROM normal, no CVA tenderness  Lungs:     Clear to auscultation bilaterally, respirations unlabored  Chest wall:    No tenderness or deformity   Heart:    Normal heart rate. Normal rhythm. No murmurs, rubs, or gallops.  S1 and S2 normal  Abdomen:     Soft, non-tender, bowel sounds active all four quadrants,    no masses, no organomegaly  Genitalia:    deferred  Rectal:    deferred  Extremities:   All extremities are intact. No cyanosis or edema. Tender side of left mid-foot, slightly swollen.   Pulses:   2+ and symmetric all extremities  Skin:   Skin color, texture, turgor normal, no rashes or lesions  Lymph nodes:   Cervical, supraclavicular, and axillary nodes normal  Neurologic:   CNII-XII intact. Normal strength, sensation and reflexes      throughout    Last depression screening scores    01/26/2024    8:45 AM 09/24/2023    2:41 PM 07/04/2023   10:01 AM  PHQ 2/9 Scores  PHQ - 2 Score 0 0 2  PHQ- 9 Score 2  9   Last fall risk screening    01/26/2024    8:43 AM  Fall Risk   Falls in the past year? 0  Number falls in past yr: 0  Injury with Fall? 0  Risk for fall due to : No Fall Risks  Follow up Falls evaluation completed   Last Audit-C alcohol use screening    01/22/2024    8:54 AM  Alcohol Use Disorder Test (AUDIT)  1. How often do you have a drink containing alcohol? 2  2. How many drinks containing alcohol do you have on a typical day when you are drinking? 1  3. How often do you have six or more drinks on one occasion? 1  AUDIT-C Score 4   4. How often during the last year have you found that you were not able to stop drinking once you had started? 0  5.  How often during the last year have you failed to do what was normally expected from you because of drinking? 0  6. How often during the last year have you needed a first drink in the morning to get yourself going after a heavy drinking session? 0  7. How often during the last year have you had a feeling of guilt of remorse after drinking? 0  8. How often during the last year have you been unable to remember what happened the night before because you had  been drinking? 0  9. Have you or someone else been injured as a result of your drinking? 0  10. Has a relative or friend or a doctor or another health worker been concerned about your drinking or suggested you cut down? 0  Alcohol Use Disorder Identification Test Final Score (AUDIT) 4      Patient-reported   A score of 3 or more in women, and 4 or more in men indicates increased risk for alcohol abuse, EXCEPT if all of the points are from question 1   No results found for any visits on 01/26/24.  Assessment & Plan    Routine Health Maintenance and Physical Exam  Exercise Activities and Dietary recommendations  Goals   None     Immunization History  Administered Date(s) Administered   PFIZER Comirnaty(Gray Top)Covid-19 Tri-Sucrose Vaccine 08/29/2019, 09/28/2019   PFIZER(Purple Top)SARS-COV-2 Vaccination 08/29/2019, 09/28/2019   Tdap 10/17/2010   Zoster Recombinant(Shingrix ) 02/03/2018, 02/01/2022    Health Maintenance  Topic Date Due   HIV Screening  Never done   Hepatitis C Screening  Never done   Hepatitis B Vaccines (1 of 3 - 19+ 3-dose series) Never done   Colonoscopy  Never done   DTaP/Tdap/Td (2 - Td or Tdap) 10/16/2020   COVID-19 Vaccine (5 - 2024-25 season) 02/11/2024 (Originally 03/02/2023)   INFLUENZA VACCINE  01/30/2024   Zoster Vaccines- Shingrix   Completed   HPV VACCINES  Aged Out   Meningococcal B Vaccine  Aged Out    Discussed health benefits of physical activity, and encouraged him to engage in regular exercise appropriate for his age and condition.    2. Hyperlipidemia, mixed Has been off rosuvastatin  several months and trying to manage with dietary improvements.  - CBC - Lipid panel  3. Fam hx-ischem heart disease   4. Elevated PSA Followed by Dr. Adolm.   5. Left foot pain Persistent over a month. Recommend return to podiatry for further evaluation.   6. Need for hepatitis B screening test He states he had hep b vaccine with school system  several years ago.  - Hepatitis B Surface AntiBODY  7. Need for hepatitis C screening test  - Hepatitis C antibody  8. Hyperglycemia  - Hemoglobin A1c  9. Colon cancer screening  - Ambulatory referral to Gastroenterology        Nancyann Perry, MD  [facility] [phone] (phone) [phone] (fax)  [facility]

## 2024-01-26 NOTE — Patient Instructions (Signed)
 Marland Kitchen  Please review the attached list of medications and notify my office if there are any errors.   . Please bring all of your medications to every appointment so we can make sure that our medication list is the same as yours.

## 2024-03-30 ENCOUNTER — Encounter: Payer: Self-pay | Admitting: Urology

## 2025-01-20 ENCOUNTER — Ambulatory Visit: Admitting: Urology

## 2025-01-25 ENCOUNTER — Ambulatory Visit: Admitting: Urology
